# Patient Record
Sex: Male | Born: 1995 | Race: White | Hispanic: No | Marital: Single | Smoking: Never smoker
Health system: Southern US, Community
[De-identification: ages and names within clinical notes are randomized; demographics above are authoritative.]

## PROBLEM LIST (undated history)

## (undated) DIAGNOSIS — U071 COVID-19: Secondary | ICD-10-CM

## (undated) DIAGNOSIS — F909 Attention-deficit hyperactivity disorder, unspecified type: Secondary | ICD-10-CM

## (undated) HISTORY — PX: TONSILLECTOMY: SUR1361

## (undated) HISTORY — PX: WISDOM TOOTH EXTRACTION: SHX21

---
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
## 2015-06-13 ENCOUNTER — Ambulatory Visit
Admission: EM | Admit: 2015-06-13 | Discharge: 2015-06-13 | Disposition: A | Discharge status: Home / Self Care | Payer: Federal, State, Local not specified - PPO | Attending: Family Medicine | Admitting: Family Medicine

## 2015-06-13 DIAGNOSIS — A084 Viral intestinal infection, unspecified: Secondary | ICD-10-CM

## 2015-06-13 DIAGNOSIS — L739 Follicular disorder, unspecified: Secondary | ICD-10-CM

## 2015-06-13 DIAGNOSIS — IMO0001 Reserved for inherently not codable concepts without codable children: Secondary | ICD-10-CM

## 2015-06-13 DIAGNOSIS — L500 Allergic urticaria: Secondary | ICD-10-CM

## 2015-06-13 DIAGNOSIS — J302 Other seasonal allergic rhinitis: Secondary | ICD-10-CM | POA: Diagnosis not present

## 2015-06-13 DIAGNOSIS — H1013 Acute atopic conjunctivitis, bilateral: Secondary | ICD-10-CM

## 2015-06-13 DIAGNOSIS — H6593 Unspecified nonsuppurative otitis media, bilateral: Secondary | ICD-10-CM

## 2015-06-13 MED ORDER — SALINE SPRAY 0.65 % NA SOLN: 2.0000 | NASAL | Status: DC

## 2015-06-13 MED ORDER — RANITIDINE HCL 150 MG PO TABS: 150.0000 mg | ORAL_TABLET | Freq: Two times a day (BID) | ORAL | Status: DC

## 2015-06-13 MED ORDER — PREDNISONE 50 MG PO TABS: 50.0000 mg | ORAL_TABLET | Freq: Every day | ORAL | Status: AC

## 2015-06-13 MED ORDER — EPINEPHRINE 0.3 MG/0.3ML IJ SOAJ: 0.3000 mg | Freq: Once | INTRAMUSCULAR | Status: DC

## 2015-06-13 MED ORDER — CETIRIZINE HCL 10 MG PO TABS: 10.0000 mg | ORAL_TABLET | Freq: Two times a day (BID) | ORAL | Status: DC

## 2015-06-13 MED ORDER — HYDROCORTISONE 1 % EX CREA: TOPICAL_CREAM | CUTANEOUS | Status: DC

## 2015-06-13 NOTE — ED Provider Notes (Signed)
CSN: [id number]     Arrival date & time 06/13/15  1919 History   First MD Initiated Contact with Patient 06/13/15 1953     Chief Complaint  Patient presents with  . Breathing Problem    Pt reports he had an episode this afternoon after working with straw where he couldn't get his breath, nose was running, and he was coughing. Resolved with Benadryl. Also has has a "bump" near genitalia.    (Consider location/radiation/quality/duration/timing/severity/associated sxs/prior Treatment) HPI Comments: Single caucasian male works at [employer] known seasonal allergies and worsening symptoms when exposed to hay.  Also has had diarrhea x 3 days, eating usual diet denied bloody or black stools.  No known sick contacts.  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  Typically does not take any medications unless allergies really bad than benadryl 1-2 tabs po prn.  Really runny nose after hay exposure, itchy eyes, hives, coughing/hard to breath took benadryl and all symptoms except itchy eyes resolved.  Feeling good now except still hay dust on clothes and skin and itchy but no rash.  Has noticed pea sized lump left inguinal area would like checked also--been there for months (since last year) has tried squeezing it and did get pus out of it one time weeks ago.  Denied redness, rash.  Does trim genital hair.  Denied new sexual partners, fever, chills.  Denied asthma as child.  Never had allergy testing.  Allergic to peanuts.  Patient is a 20 y.o. male presenting with difficulty breathing. The history is provided by the patient.  Breathing Problem This is a new problem. The current episode started 3 to 5 hours ago. The problem occurs rarely. The problem has been resolved. Associated symptoms include shortness of breath. Pertinent negatives include no chest pain, no abdominal pain and no headaches. The symptoms are aggravated by exertion and coughing. The symptoms are relieved by medications.  Treatments tried: benadryl 25mg  po. The treatment provided significant relief.    History reviewed. No pertinent past medical history. Past Surgical History  Procedure Laterality Date  . Tonsillectomy     History reviewed. No pertinent family history. Social History  Substance Use Topics  . Smoking status: Never Smoker   . Smokeless tobacco: Never Used  . Alcohol Use: Yes     Comment: social    Review of Systems  Constitutional: Negative for fever, chills, diaphoresis, activity change, appetite change, fatigue and unexpected weight change.  HENT: Positive for congestion, postnasal drip, rhinorrhea and sneezing. Negative for dental problem, drooling, ear discharge, ear pain, facial swelling, hearing loss, mouth sores, nosebleeds, sinus pressure, sore throat, tinnitus, trouble swallowing and voice change.   Eyes: Negative for photophobia, pain, discharge, redness, itching and visual disturbance.  Respiratory: Positive for cough, shortness of breath and wheezing. Negative for choking, chest tightness and stridor.   Cardiovascular: Negative for chest pain, palpitations and leg swelling.  Gastrointestinal: Positive for diarrhea. Negative for nausea, vomiting, abdominal pain, constipation, blood in stool and abdominal distention.  Endocrine: Negative for cold intolerance and heat intolerance.  Genitourinary: Negative for dysuria.  Musculoskeletal: Negative for myalgias, back pain, joint swelling, arthralgias, gait problem, neck pain and neck stiffness.  Skin: Negative for color change, pallor, rash and wound.  Allergic/Immunologic: Positive for environmental allergies and food allergies. Negative for immunocompromised state.  Neurological: Negative for dizziness, tremors, seizures, syncope, facial asymmetry, speech difficulty, weakness, light-headedness, numbness and headaches.  Hematological: Negative for adenopathy. Does not bruise/bleed easily.  Psychiatric/Behavioral: Negative for  behavioral problems, confusion, sleep disturbance and agitation.    Allergies  Peanuts  Home Medications   Prior to Admission medications   Medication Sig Start Date End Date Taking? Authorizing Provider  cetirizine (ZYRTEC) 10 MG tablet Take 1 tablet (10 mg total) by mouth 2 (two) times daily. 06/13/15 07/14/15  Barbaraann Barthelina A Calypso Hagarty, NP  EPINEPHrine (EPIPEN 2-PAK) 0.3 mg/0.3 mL IJ SOAJ injection Inject 0.3 mLs (0.3 mg total) into the muscle once. 06/13/15   Barbaraann Barthelina A Tymira Horkey, NP  hydrocortisone cream 1 % Apply to affected area 2 times daily x 7 days 06/13/15   Barbaraann Barthelina A Jemel Ono, NP  predniSONE (DELTASONE) 50 MG tablet Take 1 tablet (50 mg total) by mouth daily with breakfast. 06/13/15 06/17/15  Barbaraann Barthelina A Harvin Konicek, NP  ranitidine (ZANTAC) 150 MG tablet Take 1 tablet (150 mg total) by mouth 2 (two) times daily. 06/13/15   Barbaraann Barthelina A Morrigan Wickens, NP  sodium chloride (OCEAN) 0.65 % SOLN nasal spray Place 2 sprays into both nostrils every 2 (two) hours while awake. 06/13/15 07/14/15  Barbaraann Barthelina A Marco Adelson, NP   Meds Ordered and Administered this Visit  Medications - No data to display  BP 104/56 mmHg  Pulse 90  Temp(Src) 98.4 F (36.9 C) (Oral)  Resp 18  Ht 5\' 5"  (1.651 m)  Wt 128 lb (58.06 kg)  BMI 21.30 kg/m2  SpO2 98% No data found.   Physical Exam  Constitutional: He is oriented to person, place, and time. Vital signs are normal. He appears well-developed and well-nourished. He is active and cooperative.  Non-toxic appearance. He does not have a sickly appearance. He does not appear ill. No distress.  HENT:  Head: Normocephalic and atraumatic.  Right Ear: Hearing, tympanic membrane, external ear and ear canal normal.  Left Ear: Hearing, tympanic membrane, external ear and ear canal normal.  Nose: Mucosal edema and rhinorrhea present. No nose lacerations, sinus tenderness, nasal deformity, septal deviation or nasal septal hematoma. No epistaxis.  No foreign bodies. Right sinus exhibits no maxillary sinus  tenderness and no frontal sinus tenderness. Left sinus exhibits no maxillary sinus tenderness and no frontal sinus tenderness.  Mouth/Throat: Uvula is midline and mucous membranes are normal. Mucous membranes are not pale, not dry and not cyanotic. He does not have dentures. No oral lesions. No trismus in the jaw. Normal dentition. No dental abscesses, uvula swelling, lacerations or dental caries. Posterior oropharyngeal edema and posterior oropharyngeal erythema present. No oropharyngeal exudate or tonsillar abscesses.  Cobblestoning posterior pharynx; bilateral TMs with air fluid level clear; nasal turbinates edema/erythema clear discharge  Eyes: EOM and lids are normal. Pupils are equal, round, and reactive to light. Right eye exhibits no chemosis, no discharge, no exudate and no hordeolum. No foreign body present in the right eye. Left eye exhibits no chemosis, no discharge, no exudate and no hordeolum. No foreign body present in the left eye. Right conjunctiva is injected. Right conjunctiva has no hemorrhage. Left conjunctiva is injected. Left conjunctiva has no hemorrhage. No scleral icterus. Right eye exhibits normal extraocular motion and no nystagmus. Left eye exhibits normal extraocular motion and no nystagmus. Right pupil is round and reactive. Left pupil is round and reactive. Pupils are equal.  3+ injection bilaterally eyelid and bulbar conjunctiva  Neck: Trachea normal and normal range of motion. Neck supple. No tracheal tenderness, no spinous process tenderness and no muscular tenderness present. No rigidity. No tracheal deviation, no edema, no erythema and normal range of motion present. No thyroid  mass and no thyromegaly present.  Cardiovascular: Normal rate, regular rhythm, S1 normal, S2 normal, normal heart sounds and intact distal pulses.  PMI is not displaced.  Exam reveals no gallop and no friction rub.   No murmur heard. Pulses:      Radial pulses are 2+ on the right side, and 2+ on  the left side.  Pulmonary/Chest: Effort normal and breath sounds normal. No stridor. No respiratory distress. He has no decreased breath sounds. He has no wheezes. He has no rhonchi. He has no rales.  Abdominal: Soft. Normal appearance and bowel sounds are normal. He exhibits no distension, no fluid wave and no ascites. There is no tenderness. There is no rigidity, no guarding and no CVA tenderness. Hernia confirmed negative in the ventral area.  Genitourinary:     Patient adjusted underwear to show affected area did not completely disrobe  Musculoskeletal: Normal range of motion. He exhibits no edema or tenderness.       Right shoulder: Normal.       Left shoulder: Normal.       Right elbow: Normal.      Left elbow: Normal.       Right hip: Normal.       Left hip: Normal.       Right knee: Normal.       Left knee: Normal.       Cervical back: Normal.       Thoracic back: Normal.       Right hand: Normal.       Left hand: Normal.  Lymphadenopathy:       Head (right side): No submental, no submandibular, no tonsillar, no preauricular, no posterior auricular and no occipital adenopathy present.       Head (left side): No submental, no submandibular, no tonsillar, no preauricular, no posterior auricular and no occipital adenopathy present.    He has no cervical adenopathy.       Right cervical: No superficial cervical, no deep cervical and no posterior cervical adenopathy present.      Left cervical: No superficial cervical, no deep cervical and no posterior cervical adenopathy present.       Left: No inguinal adenopathy present.  Neurological: He is alert and oriented to person, place, and time. He is not disoriented. He displays no atrophy and no tremor. No cranial nerve deficit or sensory deficit. He exhibits normal muscle tone. He displays no seizure activity. Coordination and gait normal. GCS eye subscore is 4. GCS verbal subscore is 5. GCS motor subscore is 6.  Strength 5/5  extremities equal bilaterally  Skin: Skin is warm, dry and intact. No abrasion, no bruising, no burn, no ecchymosis, no laceration, no lesion, no petechiae and no rash noted. He is not diaphoretic. No cyanosis or erythema. No pallor. Nails show no clubbing.  Skin covered in dirt/dust arms/face/neck  Psychiatric: He has a normal mood and affect. His speech is normal and behavior is normal. Judgment and thought content normal. Cognition and memory are normal.  Nursing note and vitals reviewed.   ED Course  Procedures (including critical care time)  Labs Review Labs Reviewed - No data to display  Imaging Review No results found.  2000 refused dexamethasone IM or po medication administration in clinic for allergies.  MDM   1. Allergic reaction, urticaria   2. Seasonal allergic rhinitis   3. Viral gastroenteritis   4. Otitis media with effusion, bilateral   5. Folliculitis    Benadryl causes  much sedation for patient discussed may use zyrtec 10mg  po BID or zyrtec 10mg  po qam and then benadryl 25-50mg  po pm/HS as directed.  Prednisone 50mg  po daily x 5 days breakfast.  Epipen prescribed for patient as history of urticaria with hay exposure prior to this episode of bronchospasm, diarrhea, rhinitis, itching.  Recommended patient follow up with allergist for testing.  Consider allergy induced asthma testing.  Patient has been trained by [employer] how to administer epipen in the past.  Discussed with patient recurrent exposure to known allergens can have worse reactions on subsequent exposures.  Recommended he not expose himself to known allergens this week at work. Patient reported he is currently having seasonal allergies typically doesn't take any benadryl daily only if very bad symptoms.  Discussed with patient viral illness, seasonal allergies may have caused ideopathic reaction or enhanced reaction when exposed to known trigger-hay today.  Consider singulair 10mg  po daily if zyrtec 10mg  po  daily not controlling symptoms patient refused at this time.  Zantac 150mg  po BID if urticaria reoccurs.  Symptomatic therapy suggested.  Warm to cool water soaks or tepid showers after exposure to possible trigger--cat and grass.  Avoid possible triggers until further evaluation from allergist.  Call or return to clinic as needed if these symptoms worsen or fail to improve as anticipated.  Go to ER if difficulty breathing, swallowing, dizziness or chest pain for immediate evaluation and treatment follow up with [facility] if ER visit required.   Exitcare handout on allergic uticaria given to patient.  Directed to follow up with [facility] for allergist/testing referral.  Patient/mother verbalized agreement and understanding of treatment plan and had no further questions at this time.   P2:  Avoidance and hand washing.  Patient may use normal saline nasal spray as needed.  Consider antihistamine or nasal steroid use.  Avoid triggers if possible.  Shower prior to bedtime if exposed to triggers.  If allergic dust/dust mites recommend mattress/pillow covers/encasements; washing linens, vacuuming, sweeping, dusting weekly.  Call or return to clinic as needed if these symptoms worsen or fail to improve as anticipated.   Exitcare handout on allergic rhinitis given to patient.  Patient verbalized understanding of instructions, agreed with plan of care and had no further questions at this time.  P2:  Avoidance and hand washing.   Start antihistamine medication.  Shower after allergen exposure prior to bed.  Hygiene discussed.  Patient to apply warm packs prn as directed.  Instructed patient to not rub eyes.  May use over the counter eye drops/tears for pain/symptom relief.  Return to clinic if headache, fever greater than 100.62F, nausea/vomiting, purulent discharge/matting unable to open eye without using fingers, foreign body sensation, ciliary flush, worsening photophobia or vision.  Call or return to clinic as needed if these  symptoms worsen or fail to improve as anticipated.  Patient given Exitcare handout on allergic conjunctivitis.  Patient verbalized agreement and understanding of treatment plan.   P2:  Hand washing   I have recommended clear fluids and bland diet.  Avoid dairy/spicy, fried and large portions of meat while having nausea.  If vomiting hold po intake x 1 hour.  Then sips clear fluids like broths, ginger ale, power ade, gatorade, pedialyte may advance to soft/bland if no vomiting x 24 hours and appetite returned otherwise hydration main focus.     Return to the clinic if symptoms persist or worsen; I have alerted the patient to call if high fever, dehydration, marked weakness, fainting,  increased abdominal pain, blood in stool or vomit (red or black).  Discussed with patient allergic reactions could also cause stomach upset/diarrhea.   Exitcare handout on gastroenteritis given to patient. Spouse and Patient verbalized agreement and understanding of treatment plan and had no further questions at this time.  Supportive treatment.   No evidence of invasive bacterial infection, non toxic and well hydrated.  This is most likely self limiting viral infection.  I do not see where any further testing or imaging is necessary at this time.   I will suggest supportive care, rest, good hygiene and encourage the patient to take adequate fluids.  The patient is to return to clinic or EMERGENCY ROOM if symptoms worsen or change significantly e.g. ear pain, fever, purulent discharge from ears or bleeding.  Exitcare handout on otitis media with effusion given to patient.  Patient verbalized agreement and understanding of treatment plan.    Discussed folliculitis, pseudofolliculitis barbae with patient e.g. Use shaving cream, shave with direction of hair growth not against, lather up well and shower/bathe first to allow hair to soften.  Refrain from shaving at this time until current inflammation resolved.  May apply OTC  hydrocortisone 1% cream sparingly to affected hair follicles if irritation after shaving. Wash hands thoroughly after application.   Discussed the difference between razor burn, cellulitis and folliculitis with patient.  Exitcare handout on folliculitis given to patient.  Patient verbalized understanding of information, instructions, agreed with plan of care and had no further questions at this time.     Barbaraann Barthel, NP 06/13/15 2103  Barbaraann Barthel, NP 06/13/15 2105

## 2015-06-13 NOTE — Discharge Instructions (Signed)
Allergic Rhinitis Allergic rhinitis is when the mucous membranes in the nose respond to allergens. Allergens are particles in the air that cause your body to have an allergic reaction. This causes you to release allergic antibodies. Through a chain of events, these eventually cause you to release histamine into the blood stream. Although meant to protect the body, it is this release of histamine that causes your discomfort, such as frequent sneezing, congestion, and an itchy, runny nose.  CAUSES Seasonal allergic rhinitis (hay fever) is caused by pollen allergens that may come from grasses, trees, and weeds. Year-round allergic rhinitis (perennial allergic rhinitis) is caused by allergens such as house dust mites, pet dander, and mold spores. SYMPTOMS  Nasal stuffiness (congestion).  Itchy, runny nose with sneezing and tearing of the eyes. DIAGNOSIS Your health care provider can help you determine the allergen or allergens that trigger your symptoms. If you and your health care provider are unable to determine the allergen, skin or blood testing may be used. Your health care provider will diagnose your condition after taking your health history and performing a physical exam. Your health care provider may assess you for other related conditions, such as asthma, pink eye, or an ear infection. TREATMENT Allergic rhinitis does not have a cure, but it can be controlled by:  Medicines that block allergy symptoms. These may include allergy shots, nasal sprays, and oral antihistamines.  Avoiding the allergen. Hay fever may often be treated with antihistamines in pill or nasal spray forms. Antihistamines block the effects of histamine. There are over-the-counter medicines that may help with nasal congestion and swelling around the eyes. Check with your health care provider before taking or giving this medicine. If avoiding the allergen or the medicine prescribed do not work, there are many new medicines  your health care provider can prescribe. Stronger medicine may be used if initial measures are ineffective. Desensitizing injections can be used if medicine and avoidance does not work. Desensitization is when a patient is given ongoing shots until the body becomes less sensitive to the allergen. Make sure you follow up with your health care provider if problems continue. HOME CARE INSTRUCTIONS It is not possible to completely avoid allergens, but you can reduce your symptoms by taking steps to limit your exposure to them. It helps to know exactly what you are allergic to so that you can avoid your specific triggers. SEEK MEDICAL CARE IF:  You have a fever.  You develop a cough that does not stop easily (persistent).  You have shortness of breath.  You start wheezing.  Symptoms interfere with normal daily activities.   This information is not intended to replace advice given to you by your health care provider. Make sure you discuss any questions you have with your health care provider.   Document Released: 10/22/2000 Document Revised: 02/17/2014 Document Reviewed: 10/04/2012 Elsevier Interactive Patient Education 2016 [employer] Inc[employer]  Allergy Skin Testing WHY AM I HAVING THIS TEST? Allergy skin testing is done to check whether you have an allergy to something. Testing may be done in one of two ways:  Injecting a small amount of the substance you may be allergic to (allergen).  Applying patches to your skin. Your health care provider will determine the results of your test by checking for an allergic reaction on the skin where the allergen was injected or where the patches were applied.  HOW DO I PREPARE FOR THE TEST?  Let your health care provider know about all medicines you  are taking, including vitamins, herbs, eye drops, creams, and over-the-counter medicines. Some medicines can affect test results. Your health care provider will let you know when to stop taking those medicines and  when you can begin taking them again.  If you are having a patch test:  Do not apply ointments, creams, or lotion to the skin where the patch will be placed. Usually, the patches are placed on your forearm or on your back.  Bring any items that you think you are allergic to, such as cosmetics, soaps, and perfume. WILL I NEED TO DO ANYTHING AT HOME? If you will receive an injection, you will not need to do anything at home. If patches will be applied to your skin, you will need to:  Wear them for 48 hours.  Return to your health care provider's office to have them removed. Do not remove them yourself.  Avoid bathing and activities that cause heavy sweating until after the patches are removed. WHAT ARE THE REFERENCE RANGES? Reference ranges are considered healthy ranges established after testing a large group of healthy people. Reference ranges may vary among different people, labs, and hospitals.  The reference range for allergy skin testing is a swollen area of skin (wheal) less than 3mm in diameter, with surrounding redness and swelling (flare) less than 10mm in diameter.  WHAT DO THE RESULTS MEAN?  A result within the reference range means you are probably not allergic to the allergen.  A result in which the wheal is 3mm or more and the flare is 10mm or more means you are likely allergic to the allergen. Your health care provider will consider the results of your test in addition to your symptoms before diagnosing you with an allergy. Talk with your health care provider to discuss your results, treatment options, and if necessary, the need for more tests. Talk with your health care provider if you have any questions about your results.   This information is not intended to replace advice given to you by your health care provider. Make sure you discuss any questions you have with your health care provider.   Document Released: 02/20/2004 Document Revised: 02/17/2014 Document Reviewed:  11/08/2013 Elsevier Interactive Patient Education 2016 [employer] Inc[employer]  Angioedema Angioedema is a sudden swelling of tissues, often of the skin. It can occur on the face or genitals or in the abdomen or other body parts. The swelling usually develops over a short period and gets better in 24 to 48 hours. It often begins during the night and is found when the person wakes up. The person may also get red, itchy patches of skin (hives). Angioedema can be dangerous if it involves swelling of the air passages.  Depending on the cause, episodes of angioedema may only happen once, come back in unpredictable patterns, or repeat for several years and then gradually fade away.  CAUSES  Angioedema can be caused by an allergic reaction to various triggers. It can also result from nonallergic causes, including reactions to drugs, immune system disorders, viral infections, or an abnormal gene that is passed to you from your parents (hereditary). For some people with angioedema, the cause is unknown.  Some things that can trigger angioedema include:   Foods.   Medicines, such as ACE inhibitors, ARBs, nonsteroidal anti-inflammatory agents, or estrogen.   Latex.   Animal saliva.   Insect stings.   Dyes used in X-rays.   Mild injury.   Dental work.  Surgery.  Stress.   Sudden changes  in temperature.   Exercise. SIGNS AND SYMPTOMS   Swelling of the skin.  Hives. If these are present, there is also intense itching.  Redness in the affected area.   Pain in the affected area.  Swollen lips or tongue.  Breathing problems. This may happen if the air passages swell.  Wheezing. If internal organs are involved, there may be:   Nausea.   Abdominal pain.   Vomiting.   Difficulty swallowing.   Difficulty passing urine. DIAGNOSIS   Your health care provider will examine the affected area and take a medical and family history.  Various tests may be done to help determine  the cause. Tests may include:  Allergy skin tests to see if the problem is an allergic reaction.   Blood tests to check for hereditary angioedema.   Tests to check for underlying diseases that could cause the condition.   A review of your medicines, including over-the-counter medicines, may be done. TREATMENT  Treatment will depend on the cause of the angioedema. Possible treatments include:   Removal of anything that triggered the condition (such as stopping certain medicines).   Medicines to treat symptoms or prevent attacks. Medicines given may include:   Antihistamines.   Epinephrine injection.   Steroids.   Hospitalization may be required for severe attacks. If the air passages are affected, it can be an emergency. Tubes may need to be placed to keep the airway open. HOME CARE INSTRUCTIONS   Take all medicines as directed by your health care provider.  If you were given medicines for emergency allergy treatment, always carry them with you.  Wear a medical bracelet as directed by your health care provider.   Avoid known triggers. SEEK MEDICAL CARE IF:   You have repeat attacks of angioedema.   Your attacks are more frequent or more severe despite preventive measures.   You have hereditary angioedema and are considering having children. It is important to discuss with your health care provider the risks of passing the condition on to your children. SEEK IMMEDIATE MEDICAL CARE IF:   You have severe swelling of the mouth, tongue, or lips.  You have difficulty breathing.   You have difficulty swallowing.   You faint. MAKE SURE YOU:  Understand these instructions.  Will watch your condition.  Will get help right away if you are not doing well or get worse.   This information is not intended to replace advice given to you by your health care provider. Make sure you discuss any questions you have with your health care provider.   Document Released:  04/07/2001 Document Revised: 02/17/2014 Document Reviewed: 09/20/2012 Elsevier Interactive Patient Education 2016 [employer] Inc[employer] Folliculitis Folliculitis is redness, soreness, and swelling (inflammation) of the hair follicles. This condition can occur anywhere on the body. People with weakened immune systems, diabetes, or obesity have a greater risk of getting folliculitis. CAUSES  Bacterial infection. This is the most common cause.  Fungal infection.  Viral infection.  Contact with certain chemicals, especially oils and tars. Long-term folliculitis can result from bacteria that live in the nostrils. The bacteria may trigger multiple outbreaks of folliculitis over time. SYMPTOMS Folliculitis most commonly occurs on the scalp, thighs, legs, back, buttocks, and areas where hair is shaved frequently. An early sign of folliculitis is a small, white or yellow, pus-filled, itchy lesion (pustule). These lesions appear on a red, inflamed follicle. They are usually less than 0.2 inches (5 mm) wide. When there is an infection of the follicle  that goes deeper, it becomes a boil or furuncle. A group of closely packed boils creates a larger lesion (carbuncle). Carbuncles tend to occur in hairy, sweaty areas of the body. DIAGNOSIS  Your caregiver can usually tell what is wrong by doing a physical exam. A sample may be taken from one of the lesions and tested in a lab. This can help determine what is causing your folliculitis. TREATMENT  Treatment may include:  Applying warm compresses to the affected areas.  Taking antibiotic medicines orally or applying them to the skin.  Draining the lesions if they contain a large amount of pus or fluid.  Laser hair removal for cases of long-lasting folliculitis. This helps to prevent regrowth of the hair. HOME CARE INSTRUCTIONS  Apply warm compresses to the affected areas as directed by your caregiver.  If antibiotics are prescribed, take them as directed.  Finish them even if you start to feel better.  You may take over-the-counter medicines to relieve itching.  Do not shave irritated skin.  Follow up with your caregiver as directed. SEEK IMMEDIATE MEDICAL CARE IF:   You have increasing redness, swelling, or pain in the affected area.  You have a fever. MAKE SURE YOU:  Understand these instructions.  Will watch your condition.  Will get help right away if you are not doing well or get worse.   This information is not intended to replace advice given to you by your health care provider. Make sure you discuss any questions you have with your health care provider.   Document Released: 04/07/2001 Document Revised: 02/17/2014 Document Reviewed: 04/29/2011 Elsevier Interactive Patient Education 2016 [employer] Viral Gastroenteritis Viral gastroenteritis is also known as stomach flu. This condition affects the stomach and intestinal tract. It can cause sudden diarrhea and vomiting. The illness typically lasts 3 to 8 days. Most people develop an immune response that eventually gets rid of the virus. While this natural response develops, the virus can make you quite ill. CAUSES  Many different viruses can cause gastroenteritis, such as rotavirus or noroviruses. You can catch one of these viruses by consuming contaminated food or water. You may also catch a virus by sharing utensils or other personal items with an infected person or by touching a contaminated surface. SYMPTOMS  The most common symptoms are diarrhea and vomiting. These problems can cause a severe loss of body fluids (dehydration) and a body salt (electrolyte) imbalance. Other symptoms may include:  Fever.  Headache.  Fatigue.  Abdominal pain. DIAGNOSIS  Your caregiver can usually diagnose viral gastroenteritis based on your symptoms and a physical exam. A stool sample may also be taken to test for the presence of viruses or other infections. TREATMENT  This illness  typically goes away on its own. Treatments are aimed at rehydration. The most serious cases of viral gastroenteritis involve vomiting so severely that you are not able to keep fluids down. In these cases, fluids must be given through an intravenous line (IV). HOME CARE INSTRUCTIONS   Drink enough fluids to keep your urine clear or pale yellow. Drink small amounts of fluids frequently and increase the amounts as tolerated.  Ask your caregiver for specific rehydration instructions.  Avoid:  Foods high in sugar.  Alcohol.  Carbonated drinks.  Tobacco.  Juice.  Caffeine drinks.  Extremely hot or cold fluids.  Fatty, greasy foods.  Too much intake of anything at one time.  Dairy products until 24 to 48 hours after diarrhea stops.  You may consume  probiotics. Probiotics are active cultures of beneficial bacteria. They may lessen the amount and number of diarrheal stools in adults. Probiotics can be found in yogurt with active cultures and in supplements.  Wash your hands well to avoid spreading the virus.  Only take over-the-counter or prescription medicines for pain, discomfort, or fever as directed by your caregiver. Do not give aspirin to children. Antidiarrheal medicines are not recommended.  Ask your caregiver if you should continue to take your regular prescribed and over-the-counter medicines.  Keep all follow-up appointments as directed by your caregiver. SEEK IMMEDIATE MEDICAL CARE IF:   You are unable to keep fluids down.  You do not urinate at least once every 6 to 8 hours.  You develop shortness of breath.  You notice blood in your stool or vomit. This may look like coffee grounds.  You have abdominal pain that increases or is concentrated in one small area (localized).  You have persistent vomiting or diarrhea.  You have a fever.  The patient is a child younger than 3 months, and he or she has a fever.  The patient is a child older than 3 months, and he  or she has a fever and persistent symptoms.  The patient is a child older than 3 months, and he or she has a fever and symptoms suddenly get worse.  The patient is a baby, and he or she has no tears when crying. MAKE SURE YOU:   Understand these instructions.  Will watch your condition.  Will get help right away if you are not doing well or get worse.   This information is not intended to replace advice given to you by your health care provider. Make sure you discuss any questions you have with your health care provider.   Document Released: 01/27/2005 Document Revised: 04/21/2011 Document Reviewed: 11/13/2010 Elsevier Interactive Patient Education 2016 [employer] Bronchospasm, Adult A bronchospasm is when the tubes that carry air in and out of your lungs (airways) spasm or tighten. During a bronchospasm it is hard to breathe. This is because the airways get smaller. A bronchospasm can be triggered by:  Allergies. These may be to animals, pollen, food, or mold.  Infection. This is a common cause of bronchospasm.  Exercise.  Irritants. These include pollution, cigarette smoke, strong odors, aerosol sprays, and paint fumes.  Weather changes.  Stress.  Being emotional. HOME CARE   Always have a plan for getting help. Know when to call your doctor and local emergency services (911 in the [place] Know where you can get emergency care.  Only take medicines as told by your doctor.  If you were prescribed an inhaler or nebulizer machine, ask your doctor how to use it correctly. Always use a spacer with your inhaler if you were given one.  Stay calm during an attack. Try to relax and breathe more slowly.  Control your home environment:  Change your heating and air conditioning filter at least once a month.  Limit your use of fireplaces and wood stoves.  Do not  smoke. Do not  allow smoking in your home.  Avoid perfumes and fragrances.  Get rid of pests (such as roaches  and mice) and their droppings.  Throw away plants if you see mold on them.  Keep your house clean and dust free.  Replace carpet with wood, tile, or vinyl flooring. Carpet can trap dander and dust.  Use allergy-proof pillows, mattress covers, and box spring covers.  Wash bed sheets and  blankets every week in hot water. Dry them in a dryer.  Use blankets that are made of polyester or cotton.  Wash hands frequently. GET HELP IF:  You have muscle aches.  You have chest pain.  The thick spit you spit or cough up (sputum) changes from clear or white to yellow, green, gray, or bloody.  The thick spit you spit or cough up gets thicker.  There are problems that may be related to the medicine you are given such as:  A rash.  Itching.  Swelling.  Trouble breathing. GET HELP RIGHT AWAY IF:  You feel you cannot breathe or catch your breath.  You cannot stop coughing.  Your treatment is not helping you breathe better.  You have very bad chest pain. MAKE SURE YOU:   Understand these instructions.  Will watch your condition.  Will get help right away if you are not doing well or get worse.   This information is not intended to replace advice given to you by your health care provider. Make sure you discuss any questions you have with your health care provider.   Document Released: 11/24/2008 Document Revised: 02/17/2014 Document Reviewed: 07/20/2012 Elsevier Interactive Patient Education 2016 [employer] Inc[employer] Hives Hives are itchy, red, swollen areas of the skin. They can vary in size and location on your body. Hives can come and go for hours or several days (acute hives) or for several weeks (chronic hives). Hives do not spread from person to person (noncontagious). They may get worse with scratching, exercise, and emotional stress. CAUSES   Allergic reaction to food, additives, or drugs.  Infections, including the common cold.  Illness, such as vasculitis, lupus, or  thyroid disease.  Exposure to sunlight, heat, or cold.  Exercise.  Stress.  Contact with chemicals. SYMPTOMS   Red or white swollen patches on the skin. The patches may change size, shape, and location quickly and repeatedly.  Itching.  Swelling of the hands, feet, and face. This may occur if hives develop deeper in the skin. DIAGNOSIS  Your caregiver can usually tell what is wrong by performing a physical exam. Skin or blood tests may also be done to determine the cause of your hives. In some cases, the cause cannot be determined. TREATMENT  Mild cases usually get better with medicines such as antihistamines. Severe cases may require an emergency epinephrine injection. If the cause of your hives is known, treatment includes avoiding that trigger.  HOME CARE INSTRUCTIONS   Avoid causes that trigger your hives.  Take antihistamines as directed by your caregiver to reduce the severity of your hives. Non-sedating or low-sedating antihistamines are usually recommended. Do not drive while taking an antihistamine.  Take any other medicines prescribed for itching as directed by your caregiver.  Wear loose-fitting clothing.  Keep all follow-up appointments as directed by your caregiver. SEEK MEDICAL CARE IF:   You have persistent or severe itching that is not relieved with medicine.  You have painful or swollen joints. SEEK IMMEDIATE MEDICAL CARE IF:   You have a fever.  Your tongue or lips are swollen.  You have trouble breathing or swallowing.  You feel tightness in the throat or chest.  You have abdominal pain. These problems may be the first sign of a life-threatening allergic reaction. Call your local emergency services (911 in [place] MAKE SURE YOU:   Understand these instructions.  Will watch your condition.  Will get help right away if you are not doing well or get worse.  This information is not intended to replace advice given to you by your health care  provider. Make sure you discuss any questions you have with your health care provider.   Document Released: 01/27/2005 Document Revised: 02/01/2013 Document Reviewed: 04/22/2011 Elsevier Interactive Patient Education [employer]

## 2016-08-01 ENCOUNTER — Other Ambulatory Visit: Payer: Federal, State, Local not specified - PPO

## 2016-08-08 ENCOUNTER — Ambulatory Visit: Admit: 2016-08-08 | Payer: Federal, State, Local not specified - PPO | Admitting: Surgery

## 2016-08-08 SURGERY — EXCISION, PILONIDAL CYST, EXTENSIVE
Anesthesia: Choice

## 2016-08-22 DIAGNOSIS — F419 Anxiety disorder, unspecified: Secondary | ICD-10-CM | POA: Insufficient documentation

## 2016-12-15 ENCOUNTER — Encounter: Payer: Self-pay | Admitting: *Deleted

## 2016-12-15 ENCOUNTER — Ambulatory Visit
Admission: EM | Admit: 2016-12-15 | Discharge: 2016-12-15 | Disposition: A | Discharge status: Home / Self Care | Payer: Federal, State, Local not specified - PPO | Attending: Family Medicine | Admitting: Family Medicine

## 2016-12-15 DIAGNOSIS — R21 Rash and other nonspecific skin eruption: Secondary | ICD-10-CM | POA: Diagnosis not present

## 2016-12-15 DIAGNOSIS — L739 Follicular disorder, unspecified: Secondary | ICD-10-CM | POA: Diagnosis not present

## 2016-12-15 MED ORDER — AMOXICILLIN 875 MG PO TABS: 875.0000 mg | ORAL_TABLET | Freq: Two times a day (BID) | ORAL | 0 refills | Status: DC

## 2016-12-15 NOTE — ED Triage Notes (Signed)
Generalized rash x2 weeks with itching.

## 2016-12-15 NOTE — ED Provider Notes (Signed)
[facility]    CSN: [id number] Arrival date & time: 12/15/16  1220     History   Chief Complaint Chief Complaint  Patient presents with  . Rash    HPI Garrett Hamilton is a 21 y.o. male.   C/o small, red, itchy, bumps rash on body, mainly upper arms and some on abdomen.    The history is provided by the patient.    History reviewed. No pertinent past medical history.  There are no active problems to display for this patient.   Past Surgical History:  Procedure Laterality Date  . TONSILLECTOMY         Home Medications    Prior to Admission medications   Medication Sig Start Date End Date Taking? Authorizing Provider  amoxicillin (AMOXIL) 875 MG tablet Take 1 tablet (875 mg total) 2 (two) times daily by mouth. 12/15/16   Payton Mccallumonty, Tom Ragsdale, MD  cetirizine (ZYRTEC) 10 MG tablet Take 1 tablet (10 mg total) by mouth 2 (two) times daily. 06/13/15 07/14/15  Betancourt, Jarold Songina A, NP  EPINEPHrine (EPIPEN 2-PAK) 0.3 mg/0.3 mL IJ SOAJ injection Inject 0.3 mLs (0.3 mg total) into the muscle once. 06/13/15   Betancourt, Jarold Songina A, NP  hydrocortisone cream 1 % Apply to affected area 2 times daily x 7 days 06/13/15   Betancourt, Jarold Songina A, NP  ranitidine (ZANTAC) 150 MG tablet Take 1 tablet (150 mg total) by mouth 2 (two) times daily. 06/13/15   Betancourt, Jarold Songina A, NP  sodium chloride (OCEAN) 0.65 % SOLN nasal spray Place 2 sprays into both nostrils every 2 (two) hours while awake. 06/13/15 07/14/15  BetancourtJarold Song, Tina A, NP    Family History History reviewed. No pertinent family history.  Social History Social History   Tobacco Use  . Smoking status: Never Smoker  . Smokeless tobacco: Current User  Substance Use Topics  . Alcohol use: Yes    Comment: social  . Drug use: No     Allergies   Peanuts [peanut oil]   Review of Systems Review of Systems   Physical Exam Triage Vital Signs ED Triage Vitals [12/15/16 1300]  Enc Vitals Group     BP 126/75     Pulse Rate 72   Resp 16     Temp 98.3 F (36.8 C)     Temp Source Oral     SpO2 100 %     Weight      Height      Head Circumference      Peak Flow      Pain Score      Pain Loc      Pain Edu?      Excl. in GC?    No data found.  Updated Vital Signs BP 126/75 (BP Location: Left Arm)   Pulse 72   Temp 98.3 F (36.8 C) (Oral)   Resp 16   SpO2 100%   Visual Acuity Right Eye Distance:   Left Eye Distance:   Bilateral Distance:    Right Eye Near:   Left Eye Near:    Bilateral Near:     Physical Exam  Constitutional: He appears well-developed and well-nourished. No distress.  Skin: Rash noted. Rash is papular and pustular. He is not diaphoretic.  Nursing note and vitals reviewed.    UC Treatments / Results  Labs (all labs ordered are listed, but only abnormal results are displayed) Labs Reviewed - No data to display  EKG  EKG Interpretation None  Radiology No results found.  Procedures Procedures (including critical care time)  Medications Ordered in UC Medications - No data to display   Initial Impression / Assessment and Plan / UC Course  I have reviewed the triage vital signs and the nursing notes.  Pertinent labs & imaging results that were available during my care of the patient were reviewed by me and considered in my medical decision making (see chart for details).       Final Clinical Impressions(s) / UC Diagnoses   Final diagnoses:  Folliculitis    New Prescriptions This SmartLink is deprecated. Use AVSMEDLIST instead to display the medication list for a patient.  1.  diagnosis reviewed with patient 2. rx as per orders above; reviewed possible side effects, interactions, risks and benefits  3. Follow-up prn if symptoms worsen or don't improve  Controlled Substance Prescriptions [place] Controlled Substance Registry consulted? Not Applicable   Payton Mccallum, MD 12/15/16 1435

## 2017-02-06 ENCOUNTER — Encounter: Payer: Self-pay | Admitting: *Deleted

## 2017-02-06 ENCOUNTER — Ambulatory Visit
Admission: EM | Admit: 2017-02-06 | Discharge: 2017-02-06 | Disposition: A | Discharge status: Home / Self Care | Payer: Federal, State, Local not specified - PPO | Attending: Family Medicine | Admitting: Family Medicine

## 2017-02-06 DIAGNOSIS — Z202 Contact with and (suspected) exposure to infections with a predominantly sexual mode of transmission: Secondary | ICD-10-CM

## 2017-02-06 DIAGNOSIS — Z113 Encounter for screening for infections with a predominantly sexual mode of transmission: Secondary | ICD-10-CM | POA: Diagnosis not present

## 2017-02-06 HISTORY — DX: Attention-deficit hyperactivity disorder, unspecified type: F90.9

## 2017-02-06 LAB — CHLAMYDIA/NGC RT PCR (ARMC ONLY)
CHLAMYDIA TR: NOT DETECTED
N gonorrhoeae: NOT DETECTED

## 2017-02-06 NOTE — ED Triage Notes (Addendum)
Pt here for STD testing. Recent sex with woman dx with gonorrhea. Asymptomatic at present.

## 2017-02-06 NOTE — ED Provider Notes (Signed)
[facility]    CSN: [id number] Arrival date & time: 02/06/17  1720     History   Chief Complaint Chief Complaint  Patient presents with  . Exposure to STD    HPI Garrett Hamilton is a 21 y.o. male.   21 yo male with a c/o here for STD testing after possible exposure to STD. States his recent girlfriend, with whom he had recently had unprotected sex, was recently diagnosed with gonorrhea and informed the patient yesterday. Patient denies any symptoms. Denies penile discharge, dysuria, hematuria, rash, fevers, chills.    The history is provided by the patient.    Past Medical History:  Diagnosis Date  . ADHD     There are no active problems to display for this patient.   Past Surgical History:  Procedure Laterality Date  . TONSILLECTOMY         Home Medications    Prior to Admission medications   Medication Sig Start Date End Date Taking? Authorizing Provider  FLUoxetine (PROZAC) 40 MG capsule Take 40 mg by mouth daily.   Yes [provider]  methylphenidate 36 MG PO CR tablet Take 36 mg by mouth daily.   Yes [provider]  amoxicillin (AMOXIL) 875 MG tablet Take 1 tablet (875 mg total) 2 (two) times daily by mouth. 12/15/16   Payton Mccallumonty, Magenta Schmiesing, MD  cetirizine (ZYRTEC) 10 MG tablet Take 1 tablet (10 mg total) by mouth 2 (two) times daily. 06/13/15 07/14/15  Betancourt, Jarold Songina A, NP  EPINEPHrine (EPIPEN 2-PAK) 0.3 mg/0.3 mL IJ SOAJ injection Inject 0.3 mLs (0.3 mg total) into the muscle once. 06/13/15   Betancourt, Jarold Songina A, NP  hydrocortisone cream 1 % Apply to affected area 2 times daily x 7 days 06/13/15   Betancourt, Jarold Songina A, NP  ranitidine (ZANTAC) 150 MG tablet Take 1 tablet (150 mg total) by mouth 2 (two) times daily. 06/13/15   Betancourt, Jarold Songina A, NP  sodium chloride (OCEAN) 0.65 % SOLN nasal spray Place 2 sprays into both nostrils every 2 (two) hours while awake. 06/13/15 07/14/15  Betancourt, Jarold Songina A, NP    Family History Family History    Problem Relation Age of Onset  . Healthy Mother   . Healthy Father     Social History Social History   Tobacco Use  . Smoking status: Never Smoker  . Smokeless tobacco: Current User  Substance Use Topics  . Alcohol use: Yes    Comment: social  . Drug use: No     Allergies   Peanuts [peanut oil]   Review of Systems Review of Systems   Physical Exam Triage Vital Signs ED Triage Vitals  Enc Vitals Group     BP 02/06/17 1755 124/76     Pulse Rate 02/06/17 1755 83     Resp 02/06/17 1755 16     Temp 02/06/17 1755 98.6 F (37 C)     Temp Source 02/06/17 1755 Oral     SpO2 02/06/17 1755 100 %     Weight 02/06/17 1756 138 lb (62.6 kg)     Height 02/06/17 1756 5\' 6"  (1.676 m)     Head Circumference --      Peak Flow --      Pain Score --      Pain Loc --      Pain Edu? --      Excl. in GC? --    No data found.  Updated Vital Signs BP 124/76 (BP Location: Left  Arm)   Pulse 83   Temp 98.6 F (37 C) (Oral)   Resp 16   Ht 5\' 6"  (1.676 m)   Wt 138 lb (62.6 kg)   SpO2 100%   BMI 22.27 kg/m   Visual Acuity Right Eye Distance:   Left Eye Distance:   Bilateral Distance:    Right Eye Near:   Left Eye Near:    Bilateral Near:     Physical Exam  Constitutional: He appears well-developed and well-nourished. No distress.  Genitourinary: Testes normal and penis normal. No penile erythema or penile tenderness. No discharge found.  Skin: He is not diaphoretic.  Nursing note and vitals reviewed.    UC Treatments / Results  Labs (all labs ordered are listed, but only abnormal results are displayed) Labs Reviewed  CHLAMYDIA/NGC RT PCR ([facility] ONLY)  HIV ANTIBODY (ROUTINE TESTING)  RPR    EKG  EKG Interpretation None       Radiology No results found.  Procedures Procedures (including critical care time)  Medications Ordered in UC Medications - No data to display   Initial Impression / Assessment and Plan / UC Course  I have reviewed  the triage vital signs and the nursing notes.  Pertinent labs & imaging results that were available during my care of the patient were reviewed by me and considered in my medical decision making (see chart for details).       Final Clinical Impressions(s) / UC Diagnoses   Final diagnoses:  Screen for STD (sexually transmitted disease)  STD exposure    ED Discharge Orders    None     1. diagnosis reviewed with patient 2. Check std tests as per orders above 3. Further management pending lab results 4. Follow-up prn  Controlled Substance Prescriptions [place] Controlled Substance Registry consulted? Not Applicable   Payton Mccallumonty, Kweku Stankey, MD 02/06/17 [phone]

## 2017-02-10 LAB — HIV ANTIBODY (ROUTINE TESTING W REFLEX): HIV Screen 4th Generation wRfx: NONREACTIVE

## 2017-02-10 LAB — RPR: RPR Ser Ql: NONREACTIVE

## 2017-03-09 ENCOUNTER — Ambulatory Visit
Admission: EM | Admit: 2017-03-09 | Discharge: 2017-03-09 | Disposition: A | Discharge status: Home / Self Care | Payer: Federal, State, Local not specified - PPO | Attending: Family Medicine | Admitting: Family Medicine

## 2017-03-09 ENCOUNTER — Other Ambulatory Visit: Payer: Self-pay

## 2017-03-09 DIAGNOSIS — J029 Acute pharyngitis, unspecified: Secondary | ICD-10-CM | POA: Diagnosis not present

## 2017-03-09 DIAGNOSIS — Z202 Contact with and (suspected) exposure to infections with a predominantly sexual mode of transmission: Secondary | ICD-10-CM

## 2017-03-09 DIAGNOSIS — Z113 Encounter for screening for infections with a predominantly sexual mode of transmission: Secondary | ICD-10-CM | POA: Diagnosis not present

## 2017-03-09 LAB — RAPID STREP SCREEN (MED CTR MEBANE ONLY): Streptococcus, Group A Screen (Direct): NEGATIVE

## 2017-03-09 NOTE — Discharge Instructions (Signed)
Take medication as discussed over the counter. Rest. Drink plenty of fluids.   Follow up with your primary care physician this week as needed. Return to Urgent care for new or worsening concerns.

## 2017-03-09 NOTE — ED Provider Notes (Signed)
[facility] ____________________________________________  Time seen: Approximately 5:00 PM  I have reviewed the triage vital signs and the nursing notes.   HISTORY  Chief Complaint Sore Throat  HPI Garrett Hamilton is a 22 y.o. male presenting for evaluation of sore throat present for the last 1 week.  States has had some intermittent nasal drainage, but no runny nose.  Denies coughing or fevers.  Reports sore throat currently mild, at most moderate.  States pain comes and goes, tends to be worse at night and first thing in the morning.  Denies known sick contacts, but reports he works for the [employer].  Denies associated abdominal discomfort, rash, recent sickness or other recent changes. Reports overall continues to eat and drink well. Denies chest pain, shortness of breath, abdominal pain, or rash. Denies recent sickness. Denies recent antibiotic use.  Denies seasonal allergies.  States has taken some over-the-counter ibuprofen intermittently which does help with discomfort.  Patient does also report at the end of December he may have had exposure to STD.  States that he was sexually active with a partner that had tested positive for gonorrhea.  States he was seen at that time and was tested negative, but reports he wants to make sure that he is still negative, and request to have a throat swab as well as oral sex was involved.  Denies any penile or testicular pain, swelling, discharge, rash or lesions.  Declines any other STD testing.  Past Medical History:  Diagnosis Date  . ADHD     There are no active problems to display for this patient.   Past Surgical History:  Procedure Laterality Date  . TONSILLECTOMY       No current facility-administered medications for this encounter.   Current Outpatient Medications:  .  methylphenidate 36 MG PO CR tablet, Take 36 mg by mouth daily., Disp: , Rfl:  .  cetirizine (ZYRTEC) 10 MG tablet, Take 1 tablet (10 mg total)  by mouth 2 (two) times daily., Disp: 60 tablet, Rfl: 0 .  sodium chloride (OCEAN) 0.65 % SOLN nasal spray, Place 2 sprays into both nostrils every 2 (two) hours while awake., Disp: , Rfl: 0  Allergies Peanuts [peanut oil] and Shellfish allergy  Family History  Problem Relation Age of Onset  . Healthy Mother   . Healthy Father     Social History Social History   Tobacco Use  . Smoking status: Never Smoker  . Smokeless tobacco: Current User  Substance Use Topics  . Alcohol use: Yes    Comment: social  . Drug use: No    Review of Systems Constitutional: No fever/chills ENT: Positive sore throat. Cardiovascular: Denies chest pain. Respiratory: Denies shortness of breath. Gastrointestinal: No abdominal pain.  Genitourinary: Negative for dysuria.  Musculoskeletal: Negative for back pain.  Skin: Negative for rash.  ____________________________________________   PHYSICAL EXAM:  VITAL SIGNS: ED Triage Vitals  Enc Vitals Group     BP 03/09/17 1542 118/73     Pulse Rate 03/09/17 1542 89     Resp 03/09/17 1542 17     Temp 03/09/17 1542 98.6 F (37 C)     Temp Source 03/09/17 1542 Oral     SpO2 03/09/17 1542 99 %     Weight 03/09/17 1539 140 lb (63.5 kg)     Height 03/09/17 1539 5\' 5"  (1.651 m)     Head Circumference --      Peak Flow --      Pain  Score 03/09/17 1539 5     Pain Loc --      Pain Edu? --      Excl. in GC? --     Constitutional: Alert and oriented. Well appearing and in no acute distress. Eyes: Conjunctivae are normal.  Head: Atraumatic. No sinus tenderness to palpation. No swelling. No erythema.  Ears: no erythema, normal TMs bilaterally.   Nose: no nasal congestion.  Mouth/Throat: Mucous membranes are moist. No pharyngeal erythema.  Tonsils surgically absent.  Mild posterior pharyngeal cobblestoning.  No exudate. Neck: No stridor.  No cervical spine tenderness to palpation. Hematological/Lymphatic/Immunilogical: No cervical  lymphadenopathy. Cardiovascular: Normal rate, regular rhythm. Grossly normal heart sounds.  Good peripheral circulation. Respiratory: Normal respiratory effort.  No retractions. No wheezes, rales or rhonchi. Good air movement.  Gastrointestinal: Soft and nontender.  No hepatosplenomegaly palpated. Male: Declined Musculoskeletal: Ambulatory with steady gait. No cervical, thoracic or lumbar tenderness to palpation. Neurologic:  Normal speech and language. No gait instability. Skin:  Skin appears warm, dry and intact. No rash noted. Psychiatric: Mood and affect are normal. Speech and behavior are normal.  ___________________________________________   LABS (all labs ordered are listed, but only abnormal results are displayed)  Labs Reviewed  [facility] STREP SCREEN (NOT [facility])  CULTURE, GROUP A STREP ([facility])  CHLAMYDIA/NGC RT PCR ([facility] ONLY)  CHLAMYDIA/NGC RT PCR ([facility] ONLY)    PROCEDURES Procedures   INITIAL IMPRESSION / ASSESSMENT AND PLAN / ED COURSE  Pertinent labs & imaging results that were available during my care of the patient were reviewed by me and considered in my medical decision making (see chart for details).  Well-appearing patient.  No acute distress.  Quick strep negative, will culture.  Suspect allergic versus viral.  Also discussed mono, patient declined mono testing.  Patient did request GC chlamydia urine as well as throat swab, due to recent potential exposure.  Patient declines any other STD testing.  Encourage patient to take daily over-the-counter antihistamine and over-the-counter ibuprofen as needed.  Encouraged rest, fluids, supportive care.  Discussed follow-up and return parameters.  Discussed follow up with Primary care physician this week. Discussed follow up and return parameters including no resolution or any worsening concerns. Patient verbalized understanding and agreed to plan.    ____________________________________________   FINAL CLINICAL IMPRESSION(S) / ED DIAGNOSES  Final diagnoses:  Pharyngitis, unspecified etiology  Screen for STD (sexually transmitted disease)     ED Discharge Orders    None       Note: This dictation was prepared with Dragon dictation along with smaller phrase technology. Any transcriptional errors that result from this process are unintentional.         Renford DillsMiller, Kirstan Fentress, NP 03/09/17 2106

## 2017-03-09 NOTE — ED Triage Notes (Signed)
Patient complains of sore throat that started one week ago. Patient states that the only other thing else he has noticed is some mucus upon wakening in the morning.

## 2017-03-10 LAB — CHLAMYDIA/NGC RT PCR (ARMC ONLY)
Chlamydia Tr: NOT DETECTED
N gonorrhoeae: NOT DETECTED

## 2017-03-11 LAB — MISC LABCORP TEST (SEND OUT): Labcorp test code: 188698

## 2017-03-12 ENCOUNTER — Telehealth: Payer: Self-pay | Admitting: *Deleted

## 2017-03-12 LAB — CULTURE, GROUP A STREP (THRC)

## 2017-03-18 ENCOUNTER — Encounter: Payer: Self-pay | Admitting: Emergency Medicine

## 2017-03-18 ENCOUNTER — Other Ambulatory Visit: Payer: Self-pay

## 2017-03-18 ENCOUNTER — Ambulatory Visit
Admission: EM | Admit: 2017-03-18 | Discharge: 2017-03-18 | Disposition: A | Discharge status: Home / Self Care | Payer: Federal, State, Local not specified - PPO | Attending: Family Medicine | Admitting: Family Medicine

## 2017-03-18 DIAGNOSIS — J111 Influenza due to unidentified influenza virus with other respiratory manifestations: Secondary | ICD-10-CM

## 2017-03-18 DIAGNOSIS — R69 Illness, unspecified: Secondary | ICD-10-CM | POA: Diagnosis not present

## 2017-03-18 DIAGNOSIS — R05 Cough: Secondary | ICD-10-CM

## 2017-03-18 DIAGNOSIS — R6883 Chills (without fever): Secondary | ICD-10-CM

## 2017-03-18 DIAGNOSIS — M791 Myalgia, unspecified site: Secondary | ICD-10-CM | POA: Diagnosis not present

## 2017-03-18 MED ORDER — BENZONATATE 100 MG PO CAPS: 100.0000 mg | ORAL_CAPSULE | Freq: Three times a day (TID) | ORAL | 0 refills | Status: DC | PRN

## 2017-03-18 MED ORDER — HYDROCOD POLST-CPM POLST ER 10-8 MG/5ML PO SUER: 5.0000 mL | Freq: Every evening | ORAL | 0 refills | Status: DC | PRN

## 2017-03-18 MED ORDER — OSELTAMIVIR PHOSPHATE 75 MG PO CAPS: 75.0000 mg | ORAL_CAPSULE | Freq: Two times a day (BID) | ORAL | 0 refills | Status: DC

## 2017-03-18 NOTE — ED Triage Notes (Signed)
Patient c/o cough, congestion and fever that started yesterday.  Patient took Tylenol about 1 hour ago.

## 2017-03-18 NOTE — ED Provider Notes (Signed)
[facility] ____________________________________________  Time seen: Approximately 6:04 PM  I have reviewed the triage vital signs and the nursing notes.   HISTORY  Chief Complaint Cough and Fever  HPI Garrett Hamilton is a 22 y.o. male presenting for complaints of cough, congestion, chills, body aches, and fevers. States symptoms started last night into today. States fever up to 102.5.  Reports did take Tylenol just prior to arrival.  States cough has been keeping him up last night when trying to sleep.  States has been alternating Tylenol and ibuprofen every few hours.  states believes it was Advil cold and congestion that he took yesterday that his mom given.  Reports some nasal congestion and cough, but overall chills and body aches.  States his girlfriend recently sick with some similar complaints.  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  Denies sore throat.  Overall continues to eat and drink well.  Denies vomiting or diarrhea or abdominal pain.  Denies other complaints. Denies chest pain, shortness of breath, abdominal pain, dysuria, or rash. Denies recent sickness. Denies recent antibiotic use.    Past Medical History:  Diagnosis Date  . ADHD     There are no active problems to display for this patient.   Past Surgical History:  Procedure Laterality Date  . TONSILLECTOMY       No current facility-administered medications for this encounter.   Current Outpatient Medications:  .  methylphenidate 36 MG PO CR tablet, Take 36 mg by mouth daily., Disp: , Rfl:  .  benzonatate (TESSALON PERLES) 100 MG capsule, Take 1 capsule (100 mg total) by mouth 3 (three) times daily as needed for cough., Disp: 15 capsule, Rfl: 0 .  chlorpheniramine-HYDROcodone (TUSSIONEX PENNKINETIC ER) 10-8 MG/5ML SUER, Take 5 mLs by mouth at bedtime as needed for cough. do not drive or operate machinery while taking as can cause drowsiness., Disp: 60 mL, Rfl: 0 .  oseltamivir  (TAMIFLU) 75 MG capsule, Take 1 capsule (75 mg total) by mouth every 12 (twelve) hours., Disp: 10 capsule, Rfl: 0 .  sodium chloride (OCEAN) 0.65 % SOLN nasal spray, Place 2 sprays into both nostrils every 2 (two) hours while awake., Disp: , Rfl: 0  Allergies Peanuts [peanut oil] and Shellfish allergy  Family History  Problem Relation Age of Onset  . Healthy Mother   . Healthy Father     Social History Social History   Tobacco Use  . Smoking status: Never Smoker  . Smokeless tobacco: Current User  Substance Use Topics  . Alcohol use: Yes    Comment: social  . Drug use: No    Review of Systems Constitutional: as above.  ENT: No sore throat. Cardiovascular: Denies chest pain. Respiratory: Denies shortness of breath. Gastrointestinal: No abdominal pain.  Musculoskeletal: Negative for back pain. Skin: Negative for rash.   ____________________________________________   PHYSICAL EXAM:  VITAL SIGNS: ED Triage Vitals  Enc Vitals Group     BP 03/18/17 1743 104/74     Pulse Rate 03/18/17 1743 (!) 103     Resp 03/18/17 1743 16     Temp 03/18/17 1743 (!) 102.6 F (39.2 C)     Temp Source 03/18/17 1743 Oral     SpO2 03/18/17 1743 98 %     Weight 03/18/17 1741 140 lb (63.5 kg)     Height 03/18/17 1741 5\' 5"  (1.651 m)     Head Circumference --      Peak Flow --  Pain Score 03/18/17 1740 6     Pain Loc --      Pain Edu? --      Excl. in GC? --     Constitutional: Alert and oriented. Well appearing and in no acute distress. Eyes: Conjunctivae are normal.  Head: Atraumatic. No sinus tenderness to palpation. No swelling. No erythema.  Ears: no erythema, normal TMs bilaterally.   Nose:Nasal congestion with clear rhinorrhea  Mouth/Throat: Mucous membranes are moist. No pharyngeal erythema. No tonsillar swelling or exudate.  Neck: No stridor.  No cervical spine tenderness to palpation. Hematological/Lymphatic/Immunilogical: No cervical lymphadenopathy. Cardiovascular:  Normal rate, regular rhythm. Grossly normal heart sounds.  Good peripheral circulation. Respiratory: Normal respiratory effort.  No retractions. No wheezes, rales or rhonchi. Good air movement.  Musculoskeletal: Ambulatory with steady gait. No cervical, thoracic or lumbar tenderness to palpation. Neurologic:  Normal speech and language. No gait instability. Skin:  Skin appears warm, dry and intact. No rash noted. Psychiatric: Mood and affect are normal. Speech and behavior are normal.  ___________________________________________   LABS (all labs ordered are listed, but only abnormal results are displayed)  Labs Reviewed - No data to display ____________________________________________   PROCEDURES Procedures    INITIAL IMPRESSION / ASSESSMENT AND PLAN / ED COURSE  Pertinent labs & imaging results that were available during my care of the patient were reviewed by me and considered in my medical decision making (see chart for details).  Well-appearing patient.  No acute distress.  Suspect influenza.  Discussed treatment options with patient.  Will treat with Tamiflu, PRN Tessalon Perles and as needed Tussionex.  Encourage rest, fluids, supportive care.Discussed indication, risks and benefits of medications with patient.  Discussed follow up with Primary care physician this week. Discussed follow up and return parameters including no resolution or any worsening concerns. Patient verbalized understanding and agreed to plan.   ____________________________________________   FINAL CLINICAL IMPRESSION(S) / ED DIAGNOSES  Final diagnoses:  Influenza-like illness     ED Discharge Orders        Ordered    oseltamivir (TAMIFLU) 75 MG capsule  Every 12 hours     03/18/17 1810    benzonatate (TESSALON PERLES) 100 MG capsule  3 times daily PRN     03/18/17 1810    chlorpheniramine-HYDROcodone (TUSSIONEX PENNKINETIC ER) 10-8 MG/5ML SUER  At bedtime PRN     03/18/17 1810       Note:  This dictation was prepared with Dragon dictation along with smaller phrase technology. Any transcriptional errors that result from this process are unintentional.         Renford DillsMiller, Kasen Adduci, NP 03/18/17 1913

## 2017-03-18 NOTE — Discharge Instructions (Signed)
Take medication as prescribed. Rest. Drink plenty of fluids.  ° °Follow up with your primary care physician this week as needed. Return to Urgent care for new or worsening concerns.  ° °

## 2018-06-09 DIAGNOSIS — F900 Attention-deficit hyperactivity disorder, predominantly inattentive type: Secondary | ICD-10-CM | POA: Insufficient documentation

## 2018-10-02 ENCOUNTER — Ambulatory Visit
Admission: EM | Admit: 2018-10-02 | Discharge: 2018-10-02 | Disposition: A | Discharge status: Home / Self Care | Payer: Federal, State, Local not specified - PPO

## 2018-10-02 ENCOUNTER — Other Ambulatory Visit: Payer: Self-pay

## 2018-10-02 ENCOUNTER — Encounter: Payer: Self-pay | Admitting: Emergency Medicine

## 2018-10-02 DIAGNOSIS — K649 Unspecified hemorrhoids: Secondary | ICD-10-CM | POA: Diagnosis not present

## 2018-10-02 DIAGNOSIS — K6289 Other specified diseases of anus and rectum: Secondary | ICD-10-CM | POA: Diagnosis not present

## 2018-10-02 DIAGNOSIS — L988 Other specified disorders of the skin and subcutaneous tissue: Secondary | ICD-10-CM | POA: Diagnosis not present

## 2018-10-02 MED ORDER — HYDROCORTISONE (PERIANAL) 2.5 % EX CREA: 1.0000 "application " | TOPICAL_CREAM | Freq: Two times a day (BID) | CUTANEOUS | 0 refills | Status: DC

## 2018-10-02 NOTE — ED Triage Notes (Signed)
Patient c/o rectal pain and possible hemrroid that has been there for over a week. Patient denies rectal bleeding. Patient c/o abscess between his buttock for the past 2 years.

## 2018-10-02 NOTE — ED Provider Notes (Signed)
[place], [place]   Name: Garrett Hamilton DOB: 17-Dec-1995 MRN: [id number] CSN: [id number] PCP: Patient, No Pcp Per  Arrival date and time:  10/02/18 1011  Chief Complaint:  Rectal Pain and Hemorrhoids   NOTE: Prior to seeing the patient today, I have reviewed the triage nursing documentation and vital signs. Clinical staff has updated patient's PMH/PSHx, current medication list, and drug allergies/intolerances to ensure comprehensive history available to assist in medical decision making.   History:   HPI: Garrett Hamilton is a 23 y.o. male who presents today with complaints of rectal pain that has worsened over the course of the last 1.5 weeks. Patient with PMH (+) for hemorrhoids; feels the same. Patient denies any associated rectal bleeding. Patient has been using "hemorrhoid wipes" and Preparation H suppositories without any perceived improvement in symptoms. Additionally, patient has a known pilonidal cyst that he has been advised will require surgical intervention. [facility] and only scant drainage was able to able to be produced. Patient asking to have area reassessed for possible infection. Patient denies fever or drainage. Area has improved (not as tender) since it was opened in the past.   Past Medical History:  Diagnosis Date   ADHD     Past Surgical History:  Procedure Laterality Date   TONSILLECTOMY      Family History  Problem Relation Age of Onset   Healthy Mother    Healthy Father     Social History   Tobacco Use   Smoking status: Never Smoker   Smokeless tobacco: Current User  Substance Use Topics   Alcohol use: Yes    Comment: social   Drug use: No    There are no active problems to display for this patient.   Home Medications:    Current Meds  Medication Sig   VYVANSE 20 MG capsule     Allergies:   Peanuts [peanut oil] and Shellfish allergy  Review of Systems (ROS): Review of Systems  Constitutional: Negative for  chills and fever.  Respiratory: Negative for cough and shortness of breath.   Cardiovascular: Negative for chest pain and palpitations.  Gastrointestinal: Positive for constipation and rectal pain. Negative for anal bleeding, blood in stool, diarrhea, nausea and vomiting.  All other systems reviewed and are negative.    Vital Signs: Today's Vitals   10/02/18 1021 10/02/18 1024 10/02/18 1042  BP:  117/71   Pulse:  68   Resp:  14   Temp:  98.2 F (36.8 C)   TempSrc:  Oral   SpO2:  100%   Weight: 135 lb (61.2 kg)    Height: 5\' 6"  (1.676 m)    PainSc: 3   3     Physical Exam: Physical Exam  Constitutional: He is oriented to person, place, and time and well-developed, well-nourished, and in no distress.  HENT:  Head: Normocephalic and atraumatic.  Mouth/Throat: Mucous membranes are normal.  Eyes: Pupils are equal, round, and reactive to light. EOM are normal.  Cardiovascular: Normal rate, regular rhythm, normal heart sounds and intact distal pulses. Exam reveals no gallop and no friction rub.  No murmur heard. Pulmonary/Chest: Effort normal and breath sounds normal. No respiratory distress. He has no wheezes. He has no rales.  Genitourinary: Rectum:     Tenderness and external hemorrhoid (on anal verge; TTP; mild to moderately thrombosed) present.   Neurological: He is alert and oriented to person, place, and time. Gait normal. GCS score is 15.  Skin: Skin  is warm and dry. No rash noted.  Pilonidal cyst to RIGHT buttock; scarring from previous I&D noted. No fluctuance. No increased erythema, swelling, or warmth. No drainage. Minimal tenderness extending medially culminating at the superior aspect of the natal cleft.   Psychiatric: Mood, memory, affect and judgment normal.  Nursing note and vitals reviewed.   Urgent Care Treatments / Results:   LABS: PLEASE NOTE: all labs that were ordered this encounter are listed, however only abnormal results are displayed. Labs Reviewed -  No data to display  EKG: -None  RADIOLOGY: No results found.  PROCEDURES: Procedures  MEDICATIONS RECEIVED THIS VISIT: Medications - No data to display  PERTINENT CLINICAL COURSE NOTES/UPDATES:   Initial Impression / Assessment and Plan / Urgent Care Course:  Pertinent labs & imaging results that were available during my care of the patient were personally reviewed by me and considered in my medical decision making (see lab/imaging section of note for values and interpretations).  Garrett Hamilton is a 23 y.o. male who [facility] today with complaints of peri-rectal pain and pilonidal cyst.   Patient is well appearing overall in clinic today. He does not appear to be in any acute distress. Presenting symptoms (see HPI) and exam as documented above. Cyst does not appear to be infected. [facility] with only minimal drainage. Patient does not wish to have area opened again. Dicussed warm compresses to help inflammation and promote drainage. Ultimately, he needs to see surgery for further definitive management of his pilonidal disease. Name and office contact information provided on today's AVS for Dr. Sung AmabileIsami Sakai. Patient advised the he will need to contact the office to schedule an appointment to be seen.   Regarding his per-rectal pain. There is a thrombosed hemorrhoid noted. He has had hemorrhoids in the past that resolved without intervention. Pain and swelling has been persistent x 1 week despite OTC interventions. Discussed prescription strength intervention versus attempting to drain in clinic. Discussed with patient the associated risks, which include bleeding. Patient electing for conservative management. He plans on seeing surgery for his pilonidal disease, I encouraged him to discuss management of his hemorrhoid during his appointment as well. In the interim, will treat with Proctocare-HC topical. Patient educated on the use of sitz baths to help  with associated irritation. May also use Tylenol and/or Ibuprofen as needed for pain.  I have reviewed the follow up and strict return precautions for any new or worsening symptoms. Patient is aware of symptoms that would be deemed urgent/emergent, and would thus require further evaluation either here or in the emergency department. At the time of discharge, he verbalized understanding and consent with the discharge plan as it was reviewed with him. All questions were fielded by provider and/or clinic staff prior to patient discharge.    Final Clinical Impressions / Urgent Care Diagnoses:   Final diagnoses:  Pilonidal disease  Hemorrhoids, unspecified hemorrhoid type  Rectal pain    New Prescriptions:  [place] Controlled Substance Registry consulted? Not Applicable  Meds ordered this encounter  Medications   hydrocortisone (PROCTOCARE-HC) 2.5 % rectal cream    Sig: Place 1 application rectally 2 (two) times daily.    Dispense:  30 g    Refill:  0    Recommended Follow up Care:  Patient encouraged to follow up with the following provider within the specified time frame, or sooner as dictated by the severity of his symptoms. As always, he was instructed that  for any urgent/emergent care needs, he should seek care either here or in the emergency department for more immediate evaluation.  Follow-up Information    Call  [place], Isami, DO.   Specialty: Surgery Why: Need to discuss options for management of pilonidial cyst and hemorrhoid. Contact information: [address] [place] [address] [phone]         NOTE: This note was prepared using Dragon dictation software along with smaller phrase technology. Despite my best ability to proofread, there is the potential that transcriptional errors may still occur from this process, and are completely unintentional.    Karen Kitchens, NP 10/03/18 1216

## 2018-10-02 NOTE — Discharge Instructions (Addendum)
It was very nice seeing you today in clinic. Thank you for entrusting me with your care.   Sitz baths with help with hemorrhoid. Please utilize the medications that we discussed. Your prescriptions have been called in to your pharmacy. Apply warm compresses to pilonidal cyst to help promote drainage. Ultimately, you are going to need surgical intervention.   Make arrangements to follow up with surgery. I have provided you the name and office contact information for an excellent local provider. If your symptoms/condition worsens, please seek follow up care either here or in the ER. Please remember, our [place] providers are "right here with you" when you need [place].   Again, it was my pleasure to take care of you today. Thank you for choosing our clinic. I hope that you start to feel better quickly.   Honor Loh, MSN, APRN, FNP-C, CEN Advanced Practice Provider [facility]

## 2018-11-24 ENCOUNTER — Other Ambulatory Visit: Payer: Self-pay

## 2018-11-24 DIAGNOSIS — Z0289 Encounter for other administrative examinations: Secondary | ICD-10-CM

## 2018-12-03 LAB — POCT URINALYSIS DIPSTICK
Bilirubin, UA: NEGATIVE
Blood, UA: NEGATIVE
Glucose, UA: NEGATIVE
Ketones, UA: NEGATIVE
Leukocytes, UA: NEGATIVE
Nitrite, UA: NEGATIVE
Protein, UA: NEGATIVE
Spec Grav, UA: 1.02 (ref 1.010–1.025)
Urobilinogen, UA: 0.2 E.U./dL
pH, UA: 7 (ref 5.0–8.0)

## 2019-01-11 ENCOUNTER — Other Ambulatory Visit: Payer: Self-pay

## 2019-01-11 ENCOUNTER — Ambulatory Visit: Payer: Federal, State, Local not specified - PPO | Admitting: Occupational Medicine

## 2019-01-11 ENCOUNTER — Encounter: Payer: Self-pay | Admitting: Occupational Medicine

## 2019-01-11 VITALS — BP 110/60 | HR 95 | Temp 98.3°F | Resp 12 | Ht 65.0 in | Wt 140.0 lb

## 2019-01-11 DIAGNOSIS — Z0000 Encounter for general adult medical examination without abnormal findings: Secondary | ICD-10-CM

## 2019-04-19 ENCOUNTER — Other Ambulatory Visit: Payer: Self-pay

## 2019-04-19 ENCOUNTER — Ambulatory Visit: Payer: Self-pay

## 2019-04-19 DIAGNOSIS — Z0000 Encounter for general adult medical examination without abnormal findings: Secondary | ICD-10-CM

## 2019-04-19 LAB — POCT URINALYSIS DIPSTICK
Bilirubin, UA: NEGATIVE
Blood, UA: NEGATIVE
Glucose, UA: NEGATIVE
Ketones, UA: NEGATIVE
Leukocytes, UA: NEGATIVE
Nitrite, UA: NEGATIVE
Protein, UA: NEGATIVE
Spec Grav, UA: 1.015 (ref 1.010–1.025)
Urobilinogen, UA: 0.2 E.U./dL
pH, UA: 8 (ref 5.0–8.0)

## 2019-04-19 NOTE — Progress Notes (Signed)
Patient comes in today for pre physical labs and EKG. Patient is scheduled with Dr.Hunt on 05/03/19.

## 2019-04-20 LAB — CMP12+LP+TP+TSH+6AC+PSA+CBC…
ALT: 13 IU/L (ref 0–44)
AST: 23 IU/L (ref 0–40)
Albumin/Globulin Ratio: 2.2 (ref 1.2–2.2)
Alkaline Phosphatase: 109 IU/L (ref 39–117)
BUN/Creatinine Ratio: 15 (ref 9–20)
BUN: 14 mg/dL (ref 6–20)
Basophils Absolute: 0.1 10*3/uL (ref 0.0–0.2)
Basos: 1 %
Bilirubin Total: 0.7 mg/dL (ref 0.0–1.2)
Calcium: 10.2 mg/dL (ref 8.7–10.2)
Chloride: 100 mmol/L (ref 96–106)
Chol/HDL Ratio: 2.1 ratio (ref 0.0–5.0)
Cholesterol, Total: 157 mg/dL (ref 100–199)
Creatinine, Ser: 0.91 mg/dL (ref 0.76–1.27)
Eos: 11 %
Estimated CHD Risk: <0.5 times avg. (ref 0.0–1.0)
Free Thyroxine Index: 1.9 (ref 1.2–4.9)
GFR calc Af Amer: 137 mL/min/{1.73_m2} (ref >59)
GFR calc non Af Amer: 118 mL/min/{1.73_m2} (ref >59)
GGT: 16 IU/L (ref 0–65)
Globulin, Total: 2.4 g/dL (ref 1.5–4.5)
Glucose: 81 mg/dL (ref 65–99)
HDL: 75 mg/dL (ref >39)
Hemoglobin: 14.9 g/dL (ref 13.0–17.7)
Iron: 93 ug/dL (ref 38–169)
LDH: 192 IU/L (ref 121–224)
LDL Chol Calc (NIH): 70 mg/dL (ref 0–99)
Lymphocytes Absolute: 2.1 10*3/uL (ref 0.7–3.1)
MCHC: 34.3 g/dL (ref 31.5–35.7)
MCV: 86 fL (ref 79–97)
Monocytes Absolute: 0.4 10*3/uL (ref 0.1–0.9)
Monocytes: 5 %
Phosphorus: 3.3 mg/dL (ref 2.8–4.1)
Prostate Specific Ag, Serum: 0.5 ng/mL (ref 0.0–4.0)
RBC: 5.08 x10E6/uL (ref 4.14–5.80)
TSH: 1.18 u[IU]/mL (ref 0.450–4.500)
Total Protein: 7.6 g/dL (ref 6.0–8.5)
Uric Acid: 6.2 mg/dL (ref 3.8–8.4)
VLDL Cholesterol Cal: 12 mg/dL (ref 5–40)
WBC: 7.7 10*3/uL (ref 3.4–10.8)

## 2019-04-20 LAB — CMP12+LP+TP+TSH+6AC+PSA+CBC?
Albumin: 5.2 g/dL (ref 4.1–5.2)
EOS (ABSOLUTE): 0.8 10*3/uL — ABNORMAL HIGH (ref 0.0–0.4)
Hematocrit: 43.5 % (ref 37.5–51.0)
Immature Grans (Abs): 0 10*3/uL (ref 0.0–0.1)
Immature Granulocytes: 0 %
Lymphs: 27 %
MCH: 29.3 pg (ref 26.6–33.0)
Neutrophils Absolute: 4.3 10*3/uL (ref 1.4–7.0)
Neutrophils: 56 %
Platelets: 392 10*3/uL (ref 150–450)
Potassium: 4.5 mmol/L (ref 3.5–5.2)
RDW: 11.9 % (ref 11.6–15.4)
Sodium: 139 mmol/L (ref 134–144)
T3 Uptake Ratio: 27 % (ref 24–39)
T4, Total: 6.9 ug/dL (ref 4.5–12.0)
Triglycerides: 59 mg/dL (ref 0–149)

## 2019-04-21 LAB — QUANTIFERON-TB GOLD PLUS
QuantiFERON Mitogen Value: 9.57 IU/mL
QuantiFERON Nil Value: 0.09 IU/mL
QuantiFERON TB1 Ag Value: 0.16 IU/mL
QuantiFERON TB2 Ag Value: 0.19 IU/mL
QuantiFERON-TB Gold Plus: NEGATIVE

## 2019-05-03 ENCOUNTER — Other Ambulatory Visit: Payer: Self-pay

## 2019-05-03 ENCOUNTER — Ambulatory Visit: Payer: Self-pay | Admitting: Occupational Medicine

## 2019-05-03 ENCOUNTER — Encounter: Payer: Self-pay | Admitting: Occupational Medicine

## 2019-05-03 VITALS — BP 125/82 | HR 84 | Temp 97.7°F | Resp 12 | Ht 66.0 in | Wt 137.0 lb

## 2019-05-03 DIAGNOSIS — Z0289 Encounter for other administrative examinations: Secondary | ICD-10-CM

## 2019-05-03 NOTE — Progress Notes (Signed)
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

## 2020-05-03 ENCOUNTER — Ambulatory Visit: Payer: Self-pay

## 2020-05-03 ENCOUNTER — Other Ambulatory Visit: Payer: Self-pay

## 2020-05-03 DIAGNOSIS — Z01818 Encounter for other preprocedural examination: Secondary | ICD-10-CM

## 2020-05-03 LAB — POCT URINALYSIS DIPSTICK
Bilirubin, UA: NEGATIVE
Blood, UA: POSITIVE
Glucose, UA: NEGATIVE
Ketones, UA: NEGATIVE
Leukocytes, UA: NEGATIVE
Nitrite, UA: NEGATIVE
Protein, UA: NEGATIVE
Spec Grav, UA: 1.02 (ref 1.010–1.025)
Urobilinogen, UA: 0.2 E.U./dL
pH, UA: 6 (ref 5.0–8.0)

## 2020-05-03 NOTE — Progress Notes (Signed)
Pt scheduled to complete physical 05/15/20. CL,RMA

## 2020-05-04 LAB — CMP12+LP+TP+TSH+6AC+CBC/D/PLT
ALT: 13 IU/L (ref 0–44)
AST: 24 IU/L (ref 0–40)
Albumin/Globulin Ratio: 1.9 (ref 1.2–2.2)
Albumin: 5 g/dL (ref 4.1–5.2)
Alkaline Phosphatase: 102 IU/L (ref 44–121)
BUN/Creatinine Ratio: 16 (ref 9–20)
BUN: 15 mg/dL (ref 6–20)
Basophils Absolute: 0 10*3/uL (ref 0.0–0.2)
Basos: 1 %
Bilirubin Total: 1 mg/dL (ref 0.0–1.2)
Calcium: 9.7 mg/dL (ref 8.7–10.2)
Chloride: 98 mmol/L (ref 96–106)
Chol/HDL Ratio: 2 ratio (ref 0.0–5.0)
Cholesterol, Total: 150 mg/dL (ref 100–199)
Creatinine, Ser: 0.91 mg/dL (ref 0.76–1.27)
EOS (ABSOLUTE): 0.6 10*3/uL — ABNORMAL HIGH (ref 0.0–0.4)
Eos: 9 %
Estimated CHD Risk: <0.5 times avg. (ref 0.0–1.0)
Free Thyroxine Index: 2.3 (ref 1.2–4.9)
GGT: 19 IU/L (ref 0–65)
Globulin, Total: 2.6 g/dL (ref 1.5–4.5)
Glucose: 89 mg/dL (ref 65–99)
HDL: 74 mg/dL (ref >39)
Hematocrit: 43.1 % (ref 37.5–51.0)
Hemoglobin: 14.1 g/dL (ref 13.0–17.7)
Immature Grans (Abs): 0 10*3/uL (ref 0.0–0.1)
Immature Granulocytes: 0 %
Iron: 108 ug/dL (ref 38–169)
LDH: 167 IU/L (ref 121–224)
LDL Chol Calc (NIH): 62 mg/dL (ref 0–99)
Lymphocytes Absolute: 1.8 10*3/uL (ref 0.7–3.1)
Lymphs: 28 %
MCH: 28.8 pg (ref 26.6–33.0)
MCHC: 32.7 g/dL (ref 31.5–35.7)
MCV: 88 fL (ref 79–97)
Monocytes Absolute: 0.4 10*3/uL (ref 0.1–0.9)
Monocytes: 7 %
Neutrophils Absolute: 3.6 10*3/uL (ref 1.4–7.0)
Neutrophils: 55 %
Phosphorus: 3.1 mg/dL (ref 2.8–4.1)
Platelets: 334 10*3/uL (ref 150–450)
Potassium: 4.4 mmol/L (ref 3.5–5.2)
RBC: 4.89 x10E6/uL (ref 4.14–5.80)
RDW: 12.1 % (ref 11.6–15.4)
Sodium: 139 mmol/L (ref 134–144)
T3 Uptake Ratio: 29 % (ref 24–39)
T4, Total: 8 ug/dL (ref 4.5–12.0)
TSH: 0.852 u[IU]/mL (ref 0.450–4.500)
Total Protein: 7.6 g/dL (ref 6.0–8.5)
Triglycerides: 69 mg/dL (ref 0–149)
Uric Acid: 5.1 mg/dL (ref 3.8–8.4)
VLDL Cholesterol Cal: 14 mg/dL (ref 5–40)
WBC: 6.4 10*3/uL (ref 3.4–10.8)
eGFR: 120 mL/min/{1.73_m2} (ref >59)

## 2020-05-16 ENCOUNTER — Encounter: Payer: Federal, State, Local not specified - PPO | Admitting: Emergency Medicine

## 2020-05-28 ENCOUNTER — Encounter: Payer: Self-pay | Admitting: Physician Assistant

## 2020-05-28 ENCOUNTER — Ambulatory Visit: Payer: Self-pay | Admitting: Physician Assistant

## 2020-05-28 ENCOUNTER — Other Ambulatory Visit: Payer: Self-pay

## 2020-05-28 VITALS — BP 120/78 | HR 93 | Temp 98.1°F | Resp 12 | Ht 66.0 in | Wt 136.0 lb

## 2020-05-28 DIAGNOSIS — Z0000 Encounter for general adult medical examination without abnormal findings: Secondary | ICD-10-CM

## 2020-05-28 DIAGNOSIS — L0591 Pilonidal cyst without abscess: Secondary | ICD-10-CM

## 2020-05-28 NOTE — Progress Notes (Signed)
   Subjective: Annual firefighter exam    Patient ID: Garrett Hamilton, male    DOB: 02/28/1995, 25 y.o.   MRN: [id number]  HPI Patient presents annual firefighter exam.  Patient was concerned for history of pilonidal cyst to the right buttocks.   Review of Systems    Negative septal complaint Objective:   Physical Exam No acute distress.  Temperature 98.1, pulse 93, respiration 12, BP is 120/78, patient is 93% O2 sat on room air. HEENT is unremarkable.  Neck is supple full active [place] or bruits.  Lungs are clear to auscultation.  Heart regular rate and rhythm.  Abdomen with negative HSM, normoactive bowel sounds, soft, nontender to palpation. No obvious deformity to the upper or lower extremities.  Patient has full and equal range of motion of the upper and lower extremities. No obvious cervical or lumbar spine deformity.  Patient has full and equal range of motion cervical lumbar spine.  Cranial nerves II through XII grossly intact.  Patient has a nonfluctuant nodule lesion right buttocks.       Assessment & Plan: Well exam.  Discussed no acute findings on labs results with patient.  Patient will be given a consult to surgical clinic for further evaluation and excision of pilonidal cyst.

## 2020-05-29 NOTE — Addendum Note (Signed)
Addended by: Gardner Candle on: 05/29/2020 08:23 AM   Modules accepted: Orders

## 2020-07-03 ENCOUNTER — Other Ambulatory Visit: Payer: Self-pay | Admitting: General Surgery

## 2020-07-03 NOTE — Progress Notes (Signed)
Subjective:     Patient ID: Garrett Hamilton is a 25 y.o. male.  HPI  The following portions of the patient's history were reviewed and updated as appropriate.  This an established patient is here today for: office visit. Patient is here today for evaluation of a pilonidal cyst. He has been referred by Nona Dell, PA. He states it has been present for 2-3 years and it comes and goes.       Chief Complaint  Patient presents with  . Pilonidal Cyst     BP 100/62   Pulse 83   Temp 36.8 C (98.3 F)   Ht 167.6 cm (5\' 6" )   Wt 59.4 kg (131 lb)   SpO2 98%   BMI 21.14 kg/m       Past Medical History:  Diagnosis Date  . ADHD   . Allergic state           Past Surgical History:  Procedure Laterality Date  . TONSILLECTOMY         Social History          Socioeconomic History  . Marital status: Single  Tobacco Use  . Smoking status: Never Smoker  . Smokeless tobacco: Current User    Types: Snuff  Vaping Use  . Vaping Use: Never used  Substance and Sexual Activity  . Alcohol use: Yes    Comment: occasionally  . Drug use: No  . Sexual activity: Not Currently  Social History Narrative   Father smokes in the home on occasion. 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 Peanut Anaphylaxis  . Shellfish Containing Products Hives    Current Medications        Current Outpatient Medications  Medication Sig Dispense Refill  . lisdexamfetamine (VYVANSE) 20 MG capsule Take 1 capsule (20 mg total) by mouth once daily for 30 days 30 capsule 0  . lisdexamfetamine (VYVANSE) 20 MG capsule Take 1 capsule (20 mg total) by mouth once daily for 30 days 30 capsule 0  . [START ON 07/14/2020] lisdexamfetamine (VYVANSE) 20 MG capsule Take 1 capsule (20 mg total) by mouth once daily for 30 days 30 capsule 0   No current facility-administered medications for  this visit.           Family History  Problem Relation Age of Onset  . Short stature Father   . ADD / ADHD Father   . ADD / ADHD Brother   . Diabetes type I Neg Hx   . Thyroid disease Neg Hx   . Diabetes type II Neg Hx   . Late puberty Neg Hx         Review of Systems  Constitutional: Negative for chills and fever.  Respiratory: Negative for cough.        Objective:   Physical Exam Constitutional:      Appearance: Normal appearance.  Cardiovascular:     Rate and Rhythm: Normal rate and regular rhythm.     Pulses: Normal pulses.     Heart sounds: Normal heart sounds.  Pulmonary:     Effort: Pulmonary effort is normal.     Breath sounds: Normal breath sounds.  Abdominal:     General: Abdomen is flat.     Palpations: Abdomen is soft.  Skin:         Comments:  Photo in media Thickening near the right side of the natal cleft about 3-4 cm from the anus consistent with prior inflammatory changes.  Prior I&D site on the right gluteal area noted.  No evidence of active inflammation.  Neurological:     Mental Status: He is alert and oriented to person, place, and time.  Psychiatric:        Mood and Affect: Mood normal.        Behavior: Behavior normal.    Labs and Radiology:   May 03, 2020 [facility] [place] laboratory:  CBC showed a hemoglobin of 14.1, MCV of 88, white blood cell count of 6400 with normal differential, platelet count 334,000.  Comprehensive metabolic panel showed a creatinine of 0.9.  Normal electrolytes.  Blood sugar of 89.    Assessment:     Pilonidal cyst with prior abscess.    Plan:     Indications for surgical excision reviewed.  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  He reports he can do light duty.  Anticipate this will be comfortable after week 1.  Likely return to driving week 2-3. Patient to be scheduled for surgery at a convenient date.     This note is partially prepared by Wendall Stade,  CMA acting as a scribe in the presence of Dr. Donnalee Curry, MD.   The documentation recorded by the scribe accurately reflects the service I personally performed and the decisions made by me.   Earline Mayotte, MD FACS

## 2020-07-06 ENCOUNTER — Other Ambulatory Visit: Payer: Self-pay

## 2020-07-06 ENCOUNTER — Other Ambulatory Visit
Admission: RE | Admit: 2020-07-06 | Discharge: 2020-07-06 | Disposition: A | Discharge status: Home / Self Care | Payer: Federal, State, Local not specified - PPO | Source: Ambulatory Visit | Attending: General Surgery | Admitting: General Surgery

## 2020-07-06 HISTORY — DX: COVID-19: U07.1

## 2020-07-06 NOTE — Patient Instructions (Addendum)
Your procedure is scheduled on: 07/11/20 - Wednesday Report to the Registration Desk on the 1st floor of the Medical Mall. To find out your arrival time, please call [phone] between 1PM - 3PM on: 07/10/20 - Tuesday  REMEMBER: Instructions that are not followed completely may result in serious medical risk, up to and including death; or upon the discretion of your surgeon and anesthesiologist your surgery may need to be rescheduled.  Do not eat food after midnight the night before surgery.  No gum chewing, lozengers or hard candies.  You may however, drink CLEAR liquids up to 2 hours before you are scheduled to arrive for your surgery. Do not drink anything within 2 hours of your scheduled arrival time.  Clear liquids include: - water  - apple juice without pulp - gatorade (not RED, PURPLE, OR BLUE) - black coffee or tea (Do NOT add milk or creamers to the coffee or tea) Do NOT drink anything that is not on this list.  TAKE THESE MEDICATIONS THE MORNING OF SURGERY WITH A SIP OF Water: None  One week prior to surgery: Stop Anti-inflammatories (NSAIDS) such as Advil, Aleve, Ibuprofen, Motrin, Naproxen, Naprosyn and Aspirin based products such as Excedrin, Goodys Powder, BC Powder.  Stop ANY OVER THE COUNTER supplements until after surgery.  You may however, continue to take Tylenol if needed for pain up until the day of surgery.  No Alcohol for 24 hours before or after surgery.  No Smoking including e-cigarettes for 24 hours prior to surgery.  No chewable tobacco products for at least 6 hours prior to surgery.  No nicotine patches on the day of surgery.  Do not use any "recreational" drugs for at least a week prior to your surgery.  Please be advised that the combination of cocaine and anesthesia may have negative outcomes, up to and including death. If you test positive for cocaine, your surgery will be cancelled.  On the morning of surgery brush your teeth with toothpaste  and water, you may rinse your mouth with mouthwash if you wish. Do not swallow any toothpaste or mouthwash.  Do not wear jewelry, make-up, hairpins, clips or nail polish.  Do not wear lotions, powders, or perfumes.   Do not shave body from the neck down 48 hours prior to surgery just in case you cut yourself which could leave a site for infection.  Also, freshly shaved skin may become irritated if using the CHG soap.  Contact lenses, hearing aids and dentures may not be worn into surgery.  Do not bring valuables to the hospital. [facility] is not responsible for any missing/lost belongings or valuables.   Notify your doctor if there is any change in your medical condition (cold, fever, infection).  Wear comfortable clothing (specific to your surgery type) to the hospital.  Plan for stool softeners for home use; pain medications have a tendency to cause constipation. You can also help prevent constipation by eating foods high in fiber such as fruits and vegetables and drinking plenty of fluids as your diet allows.  After surgery, you can help prevent lung complications by doing breathing exercises.  Take deep breaths and cough every 1-2 hours. Your doctor may order a device called an Incentive Spirometer to help you take deep breaths. When coughing or sneezing, hold a pillow firmly against your incision with both hands. This is called "splinting." Doing this helps protect your incision. It also decreases belly discomfort.  If you are being admitted to the hospital  overnight, leave your suitcase in the car. After surgery it may be brought to your room.  If you are being discharged the day of surgery, you will not be allowed to drive home. You will need a responsible adult (18 years or older) to drive you home and stay with you that night.   If you are taking public transportation, you will need to have a responsible adult (18 years or older) with you. Please confirm with your physician  that it is acceptable to use public transportation.   Please call the Pre-admissions Testing Dept. at [phone] if you have any questions about these instructions.  Surgery Visitation Policy:  Patients undergoing a surgery or procedure may have one family member or support person with them as long as that person is not COVID-19 positive or experiencing its symptoms.  That person may remain in the waiting area during the procedure.  Inpatient Visitation:    Visiting hours are 7 a.m. to 8 p.m. Inpatients will be allowed two visitors daily. The visitors may change each day during the patient's stay. No visitors under the age of 40. Any visitor under the age of 62 must be accompanied by an adult. The visitor must pass COVID-19 screenings, use hand sanitizer when entering and exiting the patient's room and wear a mask at all times, including in the patient's room. Patients must also wear a mask when staff or their visitor are in the room. Masking is required regardless of vaccination status.

## 2020-07-11 ENCOUNTER — Ambulatory Visit: Payer: Federal, State, Local not specified - PPO | Admitting: Anesthesiology

## 2020-07-11 ENCOUNTER — Other Ambulatory Visit: Payer: Self-pay

## 2020-07-11 ENCOUNTER — Ambulatory Visit
Admission: RE | Admit: 2020-07-11 | Discharge: 2020-07-11 | Disposition: A | Discharge status: Home / Self Care | Payer: Federal, State, Local not specified - PPO | Attending: General Surgery | Admitting: General Surgery

## 2020-07-11 ENCOUNTER — Encounter
Admission: RE | Disposition: A | Discharge status: Home / Self Care | Payer: Self-pay | Source: Home / Self Care | Attending: General Surgery

## 2020-07-11 ENCOUNTER — Encounter: Payer: Self-pay | Admitting: General Surgery

## 2020-07-11 DIAGNOSIS — Z8616 Personal history of COVID-19: Secondary | ICD-10-CM | POA: Insufficient documentation

## 2020-07-11 DIAGNOSIS — F909 Attention-deficit hyperactivity disorder, unspecified type: Secondary | ICD-10-CM | POA: Diagnosis not present

## 2020-07-11 DIAGNOSIS — Z79899 Other long term (current) drug therapy: Secondary | ICD-10-CM | POA: Insufficient documentation

## 2020-07-11 DIAGNOSIS — L0501 Pilonidal cyst with abscess: Secondary | ICD-10-CM | POA: Insufficient documentation

## 2020-07-11 HISTORY — PX: PILONIDAL CYST EXCISION: SHX744

## 2020-07-11 SURGERY — EXCISION, PILONIDAL CYST, EXTENSIVE
Anesthesia: General

## 2020-07-11 MED ORDER — LACTATED RINGERS IV SOLN: INTRAVENOUS | Status: DC

## 2020-07-11 MED ORDER — ONDANSETRON HCL 4 MG/2ML IJ SOLN
INTRAMUSCULAR | Status: AC
  Filled 2020-07-11: qty 2

## 2020-07-11 MED ORDER — LIDOCAINE HCL (PF) 1 % IJ SOLN
INTRAMUSCULAR | Status: AC
  Filled 2020-07-11: qty 30

## 2020-07-11 MED ORDER — BUPIVACAINE-EPINEPHRINE (PF) 0.5% -1:200000 IJ SOLN
INTRAMUSCULAR | Status: AC
  Filled 2020-07-11: qty 30

## 2020-07-11 MED ORDER — ACETAMINOPHEN 10 MG/ML IV SOLN
INTRAVENOUS | Status: DC | PRN
  Administered 2020-07-11: 1000 mg via INTRAVENOUS

## 2020-07-11 MED ORDER — MEPERIDINE HCL 25 MG/ML IJ SOLN: 6.2500 mg | INTRAMUSCULAR | Status: DC | PRN

## 2020-07-11 MED ORDER — PROPOFOL 500 MG/50ML IV EMUL
INTRAVENOUS | Status: DC | PRN
  Administered 2020-07-11: 150 ug/kg/min via INTRAVENOUS

## 2020-07-11 MED ORDER — KETOROLAC TROMETHAMINE 30 MG/ML IJ SOLN
INTRAMUSCULAR | Status: AC
  Filled 2020-07-11: qty 1

## 2020-07-11 MED ORDER — LIDOCAINE-EPINEPHRINE 1 %-1:100000 IJ SOLN
INTRAMUSCULAR | Status: AC
  Filled 2020-07-11: qty 1

## 2020-07-11 MED ORDER — DEXAMETHASONE SODIUM PHOSPHATE 10 MG/ML IJ SOLN
INTRAMUSCULAR | Status: DC | PRN
  Administered 2020-07-11: 10 mg via INTRAVENOUS

## 2020-07-11 MED ORDER — CHLORHEXIDINE GLUCONATE 0.12 % MT SOLN
OROMUCOSAL | Status: AC
  Filled 2020-07-11: qty 15

## 2020-07-11 MED ORDER — SEVOFLURANE IN SOLN
RESPIRATORY_TRACT | Status: AC
  Filled 2020-07-11: qty 250

## 2020-07-11 MED ORDER — MIDAZOLAM HCL 2 MG/2ML IJ SOLN
INTRAMUSCULAR | Status: AC
  Filled 2020-07-11: qty 2

## 2020-07-11 MED ORDER — DEXAMETHASONE SODIUM PHOSPHATE 10 MG/ML IJ SOLN
INTRAMUSCULAR | Status: AC
  Filled 2020-07-11: qty 1

## 2020-07-11 MED ORDER — DEXMEDETOMIDINE (PRECEDEX) IN NS 20 MCG/5ML (4 MCG/ML) IV SYRINGE
PREFILLED_SYRINGE | INTRAVENOUS | Status: DC | PRN
  Administered 2020-07-11: 8 ug via INTRAVENOUS

## 2020-07-11 MED ORDER — FAMOTIDINE 20 MG PO TABS
ORAL_TABLET | ORAL | Status: AC
  Filled 2020-07-11: qty 1

## 2020-07-11 MED ORDER — ACETAMINOPHEN 10 MG/ML IV SOLN
INTRAVENOUS | Status: AC
  Filled 2020-07-11: qty 100

## 2020-07-11 MED ORDER — LIDOCAINE HCL (PF) 2 % IJ SOLN
INTRAMUSCULAR | Status: AC
  Filled 2020-07-11: qty 2

## 2020-07-11 MED ORDER — MIDAZOLAM HCL 2 MG/2ML IJ SOLN
INTRAMUSCULAR | Status: DC | PRN
  Administered 2020-07-11: 2 mg via INTRAVENOUS

## 2020-07-11 MED ORDER — FENTANYL CITRATE (PF) 100 MCG/2ML IJ SOLN
INTRAMUSCULAR | Status: DC | PRN
  Administered 2020-07-11: 50 ug via INTRAVENOUS
  Administered 2020-07-11: 100 ug via INTRAVENOUS

## 2020-07-11 MED ORDER — FENTANYL CITRATE (PF) 250 MCG/5ML IJ SOLN
INTRAMUSCULAR | Status: AC
  Filled 2020-07-11: qty 5

## 2020-07-11 MED ORDER — BUPIVACAINE-EPINEPHRINE 0.5% -1:200000 IJ SOLN
INTRAMUSCULAR | Status: DC | PRN
  Administered 2020-07-11: 30 mL

## 2020-07-11 MED ORDER — HYDROCODONE-ACETAMINOPHEN 5-325 MG PO TABS: 1.0000 | ORAL_TABLET | ORAL | 0 refills | Status: DC | PRN

## 2020-07-11 MED ORDER — SUCCINYLCHOLINE CHLORIDE 20 MG/ML IJ SOLN
INTRAMUSCULAR | Status: DC | PRN
  Administered 2020-07-11: 80 mg via INTRAVENOUS

## 2020-07-11 MED ORDER — PROPOFOL 500 MG/50ML IV EMUL
INTRAVENOUS | Status: AC
  Filled 2020-07-11: qty 50

## 2020-07-11 MED ORDER — LIDOCAINE HCL (CARDIAC) PF 100 MG/5ML IV SOSY
PREFILLED_SYRINGE | INTRAVENOUS | Status: DC | PRN
  Administered 2020-07-11: 40 mg via INTRAVENOUS

## 2020-07-11 MED ORDER — CHLORHEXIDINE GLUCONATE 0.12 % MT SOLN
15.0000 mL | Freq: Once | OROMUCOSAL | Status: AC
  Administered 2020-07-11: 15 mL via OROMUCOSAL

## 2020-07-11 MED ORDER — OXYCODONE HCL 5 MG/5ML PO SOLN
5.0000 mg | Freq: Once | ORAL | Status: DC | PRN
Start: 2020-07-11 — End: 2020-07-12

## 2020-07-11 MED ORDER — KETOROLAC TROMETHAMINE 30 MG/ML IJ SOLN
INTRAMUSCULAR | Status: DC | PRN
  Administered 2020-07-11: 30 mg via INTRAVENOUS

## 2020-07-11 MED ORDER — METRONIDAZOLE 500 MG PO TABS: 500.0000 mg | ORAL_TABLET | Freq: Three times a day (TID) | ORAL | 0 refills | Status: AC

## 2020-07-11 MED ORDER — SODIUM CHLORIDE 0.9 % IV SOLN
1.0000 g | INTRAVENOUS | Status: AC
  Administered 2020-07-11: 1 g via INTRAVENOUS
  Filled 2020-07-11: qty 1

## 2020-07-11 MED ORDER — CHLORHEXIDINE GLUCONATE CLOTH 2 % EX PADS
6.0000 | MEDICATED_PAD | Freq: Once | CUTANEOUS | Status: DC
Start: 2020-07-11 — End: 2020-07-12

## 2020-07-11 MED ORDER — OXYCODONE HCL 5 MG PO TABS: 5.0000 mg | ORAL_TABLET | Freq: Once | ORAL | Status: DC | PRN

## 2020-07-11 MED ORDER — ONDANSETRON HCL 4 MG/2ML IJ SOLN
INTRAMUSCULAR | Status: DC | PRN
  Administered 2020-07-11: 4 mg via INTRAVENOUS

## 2020-07-11 MED ORDER — CHLORHEXIDINE GLUCONATE CLOTH 2 % EX PADS
6.0000 | MEDICATED_PAD | Freq: Once | CUTANEOUS | Status: AC
  Administered 2020-07-11: 6 via TOPICAL

## 2020-07-11 MED ORDER — FENTANYL CITRATE (PF) 100 MCG/2ML IJ SOLN: 25.0000 ug | INTRAMUSCULAR | Status: DC | PRN

## 2020-07-11 MED ORDER — FAMOTIDINE 20 MG PO TABS
20.0000 mg | ORAL_TABLET | Freq: Once | ORAL | Status: AC
  Administered 2020-07-11: 20 mg via ORAL

## 2020-07-11 MED ORDER — PROMETHAZINE HCL 25 MG/ML IJ SOLN: 6.2500 mg | INTRAMUSCULAR | Status: DC | PRN

## 2020-07-11 MED ORDER — ORAL CARE MOUTH RINSE: 15.0000 mL | Freq: Once | OROMUCOSAL | Status: AC

## 2020-07-11 MED ORDER — PROPOFOL 10 MG/ML IV BOLUS
INTRAVENOUS | Status: DC | PRN
  Administered 2020-07-11: 200 mg via INTRAVENOUS

## 2020-07-11 MED ORDER — DEXMEDETOMIDINE (PRECEDEX) IN NS 20 MCG/5ML (4 MCG/ML) IV SYRINGE
PREFILLED_SYRINGE | INTRAVENOUS | Status: AC
  Filled 2020-07-11: qty 5

## 2020-07-11 SURGICAL SUPPLY — 35 items
BLADE SURG 11 STRL SS SAFETY (MISCELLANEOUS) ×2 IMPLANT
BLADE SURG 15 STRL SS SAFETY (BLADE) ×2 IMPLANT
CANISTER SUCT 1200ML W/VALVE (MISCELLANEOUS) ×2 IMPLANT
COVER WAND RF STERILE (DRAPES) ×2 IMPLANT
DRAPE LAPAROTOMY 100X77 ABD (DRAPES) ×2 IMPLANT
DRSG TEGADERM 4X4.75 (GAUZE/BANDAGES/DRESSINGS) ×2 IMPLANT
DRSG TELFA 3X8 NADH (GAUZE/BANDAGES/DRESSINGS) ×2 IMPLANT
ELECT CAUTERY BLADE TIP 2.5 (TIP) ×2
ELECT REM PT RETURN 9FT ADLT (ELECTROSURGICAL) ×2
ELECTRODE CAUTERY BLDE TIP 2.5 (TIP) ×1 IMPLANT
ELECTRODE REM PT RTRN 9FT ADLT (ELECTROSURGICAL) ×1 IMPLANT
GLOVE SURG ENC MOIS LTX SZ7.5 (GLOVE) ×4 IMPLANT
GLOVE SURG UNDER LTX SZ8 (GLOVE) ×4 IMPLANT
GOWN STRL REUS W/ TWL LRG LVL3 (GOWN DISPOSABLE) ×2 IMPLANT
GOWN STRL REUS W/TWL LRG LVL3 (GOWN DISPOSABLE) ×4
KIT TURNOVER KIT A (KITS) ×2 IMPLANT
LABEL OR SOLS (LABEL) ×2 IMPLANT
MANIFOLD NEPTUNE II (INSTRUMENTS) ×2 IMPLANT
NEEDLE HYPO 22GX1.5 SAFETY (NEEDLE) IMPLANT
NEEDLE HYPO 25X1 1.5 SAFETY (NEEDLE) ×2 IMPLANT
NS IRRIG 500ML POUR BTL (IV SOLUTION) ×2 IMPLANT
PACK BASIN MINOR ARMC (MISCELLANEOUS) ×2 IMPLANT
SOL PREP PVP 2OZ (MISCELLANEOUS)
SOLUTION PREP PVP 2OZ (MISCELLANEOUS) IMPLANT
STRIP CLOSURE SKIN 1/2X4 (GAUZE/BANDAGES/DRESSINGS) ×2 IMPLANT
SUT ETHILON 3 0 PS 1 (SUTURE) ×2 IMPLANT
SUT PROLENE 4-0 (SUTURE)
SUT PROLENE 4-0 RB1 30XMFL BLU (SUTURE)
SUT VIC AB 2-0 CT1 (SUTURE) ×2 IMPLANT
SUT VIC AB 2-0 CT2 27 (SUTURE) IMPLANT
SUT VICRYL 3-0 27IN (SUTURE) IMPLANT
SUT VICRYL+ 3-0 144IN (SUTURE) IMPLANT
SUTURE PROLEN 4-0 RB1 30XMFL (SUTURE) IMPLANT
SWABSTK COMLB BENZOIN TINCTURE (MISCELLANEOUS) ×2 IMPLANT
SYR 10ML LL (SYRINGE) ×2 IMPLANT

## 2020-07-11 NOTE — Discharge Instructions (Signed)

## 2020-07-11 NOTE — Anesthesia Postprocedure Evaluation (Signed)
Anesthesia Post Note  Patient: Garrett Hamilton  Procedure(s) Performed: CYST EXCISION PILONIDAL EXTENSIVE (N/A )  Patient location during evaluation: PACU Anesthesia Type: General Level of consciousness: awake and alert Pain management: pain level controlled Vital Signs Assessment: post-procedure vital signs reviewed and stable Respiratory status: spontaneous breathing, nonlabored ventilation, respiratory function stable and patient connected to nasal cannula oxygen Cardiovascular status: blood pressure returned to baseline and stable Postop Assessment: no apparent nausea or vomiting Anesthetic complications: no   No complications documented.   Last Vitals:  Vitals:   07/11/20 1813 07/11/20 1815  BP: 100/87 112/83  Pulse: 74 74  Resp: (!) 23 (!) 24  Temp: (!) 36.3 C   SpO2: 100% 100%    Last Pain:  Vitals:   07/11/20 1357  TempSrc: Oral  PainSc: 0-No pain                 Cleda Mccreedy Deanne Bedgood

## 2020-07-11 NOTE — Transfer of Care (Signed)
Immediate Anesthesia Transfer of Care Note  Patient: Garrett Hamilton  Procedure(s) Performed: CYST EXCISION PILONIDAL EXTENSIVE (N/A )  Patient Location: PACU  Anesthesia Type:General  Level of Consciousness: drowsy and patient cooperative  Airway & Oxygen Therapy: Patient Spontanous Breathing and Patient connected to nasal cannula oxygen  Post-op Assessment: Report given to RN and Post -op Vital signs reviewed and stable  Post vital signs: Reviewed and stable  Last Vitals:  Vitals Value Taken Time  BP 94/63 07/11/20 1740  Temp    Pulse 58 07/11/20 1740  Resp 15 07/11/20 1740  SpO2 100 % 07/11/20 1740  Vitals shown include unvalidated device data.  Last Pain:  Vitals:   07/11/20 1357  TempSrc: Oral  PainSc: 0-No pain         Complications: No complications documented.

## 2020-07-11 NOTE — Anesthesia Preprocedure Evaluation (Addendum)
Anesthesia Evaluation  Patient identified by MRN, date of birth, ID band Patient awake    Reviewed: Allergy & Precautions, NPO status , Patient's Chart, lab work & pertinent test results  History of Anesthesia Complications Negative for: history of anesthetic complications  Airway Mallampati: I  TM Distance: >3 FB Neck ROM: Full    Dental no notable dental hx.    Pulmonary neg pulmonary ROS, neg sleep apnea, neg COPD,    breath sounds clear to auscultation- rhonchi (-) wheezing      Cardiovascular Exercise Tolerance: Good (-) hypertension(-) CAD and (-) Past MI  Rhythm:Regular Rate:Normal - Systolic murmurs and - Diastolic murmurs    Neuro/Psych negative neurological ROS  negative psych ROS   GI/Hepatic negative GI ROS, Neg liver ROS,   Endo/Other  negative endocrine ROSneg diabetes  Renal/GU negative Renal ROS     Musculoskeletal negative musculoskeletal ROS (+)   Abdominal (+) - obese,   Peds  Hematology negative hematology ROS (+)   Anesthesia Other Findings   Reproductive/Obstetrics                             Anesthesia Physical Anesthesia Plan  ASA: I  Anesthesia Plan: General   Post-op Pain Management:    Induction: Intravenous  PONV Risk Score and Plan: 1 and Ondansetron, Dexamethasone and Midazolam  Airway Management Planned: Oral ETT  Additional Equipment:   Intra-op Plan:   Post-operative Plan: Extubation in OR  Informed Consent: I have reviewed the patients History and Physical, chart, labs and discussed the procedure including the risks, benefits and alternatives for the proposed anesthesia with the patient or authorized representative who has indicated his/her understanding and acceptance.     Dental advisory given  Plan Discussed with: CRNA and Anesthesiologist  Anesthesia Plan Comments:         Anesthesia Quick Evaluation  

## 2020-07-11 NOTE — H&P (Signed)
Garrett Hamilton [id number] 1995/04/28     HPI:  25 y/o with symptomatic pilonidal disease. Prior I&D. For excision.  Surgery delayed as the patient had eaten the AM of the procedure.   Medications Prior to Admission  Medication Sig Dispense Refill Last Dose  . fluticasone (FLONASE) 50 MCG/ACT nasal spray Place 1-2 sprays into both nostrils daily as needed for allergies or rhinitis.   Past Week at Unknown time  . ibuprofen (ADVIL) 200 MG tablet Take 200-400 mg by mouth every 8 (eight) hours as needed (for pain.).   Past Week at Unknown time  . VYVANSE 20 MG capsule Take 20 mg by mouth in the morning.   07/10/2020 at Unknown time   Allergies  Allergen Reactions  . Peanuts [Peanut Oil] Swelling  . Shellfish Allergy     unknown   Past Medical History:  Diagnosis Date  . ADHD   . COVID-19    Past Surgical History:  Procedure Laterality Date  . TONSILLECTOMY    . WISDOM TOOTH EXTRACTION     Social History   Socioeconomic History  . Marital status: Single    Spouse name: Not on file  . Number of children: Not on file  . Years of education: Not on file  . Highest education level: Not on file  Occupational History  . Not on file  Tobacco Use  . Smoking status: Never Smoker  . Smokeless tobacco: Current User  Vaping Use  . Vaping Use: Never used  Substance and Sexual Activity  . Alcohol use: Yes    Comment: social  . Drug use: No  . Sexual activity: Not on file  Other Topics Concern  . Not on file  Social History Narrative   Lives alone   Social Determinants of Health   Financial Resource Strain: Not on file  Food Insecurity: Not on file  Transportation Needs: Not on file  Physical Activity: Not on file  Stress: Not on file  Social Connections: Not on file  Intimate Partner Violence: Not on file   Social History   Social History Narrative   Lives alone     ROS: Negative.     PE: HEENT: Negative. Lungs: Clear. Cardio:  RR.   Assessment/Plan:  Proceed with planned pilonidal cyst excision.    Merrily [facility] 07/11/2020

## 2020-07-11 NOTE — Op Note (Signed)
Preoperative diagnosis: Pilonidal cyst with chronic abscess.  Postoperative diagnosis: Same.  Operative procedure: Excision of chronic abscess right gluteal area and pilonidal sinus.  Operating surgeon: Donnalee Curry, MD.  Anesthesia: General endotracheal, Marcaine 0.5% with 1: 200,000 units of epinephrine, 30 cc.  Estimated blood loss: Less than 5 cc.  Clinical note: This 25 year old male has had a history of inflammation in the gluteal area for the last 2-3 years.  He is undergone incision and drainage on 1 occasion.  He has had minimal relief from the longstanding discomfort.  He is admitted now for planned surgical excision.  The patient received Invanz prior to surgery.  Operative note: Patient underwent general endotracheal anesthesia and tolerated this well.  He was rolled to the prone position and appropriately padded.  The buttocks were taped apart and the area cleansed with ChloraPrep and draped.  Marcaine was infiltrated for postoperative analgesia.  This was about 3 cm away from the planned incision.  There was a nodule area in the midline along the natal cleft and then the palpable thickening about 3 cm off the midline just superior and to the right of this area.  A elliptical incision to encompass the gluteal thickening as well as the thickening along the midportion of the natal cleft was made.  The skin was incised sharply and the remaining dissection completed with electrocautery.  In the midline there was minimal inflammation below about a centimeter from the epidermis.  On the right gluteal area inflammatory process again went right up to the dermis and avoided the deep tissue.  All of the chronically inflamed tissue was sharply excised.  A knife was used on the lateral portion of the right gluteal skin to minimize thermal injury to the dermis.  After the area was curetted closure was undertaken multiple layers with 2-0 Vicryl figure-of-eight sutures in the deep layer and a  "crotch stitch" to bring together the radial extension from the midline.  The skin was closed with interrupted 4-0 horizontal mattress sutures.  Telfa and Tegaderm dressing was applied.  The patient was taken to the PACU in stable condition.

## 2020-07-11 NOTE — Anesthesia Procedure Notes (Signed)
Procedure Name: Intubation Date/Time: 07/11/2020 4:21 PM Performed by: Jonna Clark, CRNA Pre-anesthesia Checklist: Patient identified, Patient being monitored, Timeout performed, Emergency Drugs available and Suction available Patient Re-evaluated:Patient Re-evaluated prior to induction Oxygen Delivery Method: Circle system utilized Preoxygenation: Pre-oxygenation with 100% oxygen Induction Type: IV induction Ventilation: Mask ventilation without difficulty Laryngoscope Size: Mac and 4 Grade View: Grade II Tube type: Oral Tube size: 7.0 mm Number of attempts: 1 Airway Equipment and Method: Stylet Placement Confirmation: ETT inserted through vocal cords under direct vision,  positive ETCO2 and breath sounds checked- equal and bilateral Secured at: 21 cm Tube secured with: Tape Dental Injury: Teeth and Oropharynx as per pre-operative assessment

## 2020-07-12 ENCOUNTER — Encounter: Payer: Self-pay | Admitting: General Surgery

## 2020-07-13 LAB — SURGICAL PATHOLOGY

## 2020-11-22 ENCOUNTER — Other Ambulatory Visit: Payer: Self-pay

## 2020-11-22 NOTE — Progress Notes (Signed)
Pt completed random Etoh and UDS.  [facility] specimen ID: [id number]

## 2021-01-02 ENCOUNTER — Ambulatory Visit
Admission: RE | Admit: 2021-01-02 | Discharge: 2021-01-02 | Disposition: A | Discharge status: Home / Self Care | Payer: Federal, State, Local not specified - PPO | Source: Ambulatory Visit | Attending: Emergency Medicine | Admitting: Emergency Medicine

## 2021-01-02 ENCOUNTER — Other Ambulatory Visit: Payer: Self-pay

## 2021-01-02 VITALS — BP 111/71 | HR 80 | Temp 98.3°F | Resp 18

## 2021-01-02 DIAGNOSIS — J069 Acute upper respiratory infection, unspecified: Secondary | ICD-10-CM

## 2021-01-02 MED ORDER — IPRATROPIUM BROMIDE 0.06 % NA SOLN: 2.0000 | Freq: Four times a day (QID) | NASAL | 12 refills | Status: DC

## 2021-01-02 NOTE — Discharge Instructions (Addendum)
Use the Atrovent nasal spray, 2 squirts in each nostril every 6 hours as needed for nasal congestion.  Perform sinus irrigation 2-3 times a day with a NeilMed sinus rinse kit and distilled water.  Do not use tap water.  You can use plain over-the-counter Mucinex every 6 hours to break up the stickiness of the mucus so your body can clear it.  Increase your oral fluid intake to thin out your mucus so that is also able for your body to clear more easily.  Use over-the-counter cough preparations as needed.  If you develop any new or worsening symptoms return for reevaluation or see your primary care provider.

## 2021-01-02 NOTE — ED Triage Notes (Signed)
Pt presents with chills, congestions,and sinus pressure/pain x 4 days.  Pt also believes he has a suture left over from a pilondial cyst surgery he had in June. Pt was not advised to return to have sutures removed so they stayed in appx. 2 months. Pt states that 2 days ago he felt what could be a suture.

## 2021-01-02 NOTE — ED Provider Notes (Signed)
[facility]    CSN: [id number] Arrival date & time: 01/02/21  0955      History   Chief Complaint Chief Complaint  Patient presents with   Suture / Staple Removal   Sinus Pressure    Appointment    HPI Garrett Hamilton is a 25 y.o. male.   HPI  25 year old male here for multiple complaints.  Patient's primary complaint is chills, nasal congestion, and sinus pressure that is been present for the past 4 days.  This is associated with scant intermittent clear nasal discharge, a scratchy sore throat that was present on the first day and has resolved, and a cough that is intermittently productive in the mornings only.  He is also complaining of body aches in the evening and headache.  He denies fever, ear pain, shortness breath or wheezing, or GI complaints.  Patient second complaint is that he has a suture from a pilonidal cyst surgery from June of this year.  Patient reports that his sutures remained in for 2 months before having them removed and that Dr. Bary Castilla thought there might be 1 remaining.  Patient reports that the suture erupted but is unsure of when and he is here for removal.  Past Medical History:  Diagnosis Date   ADHD    COVID-19     Patient Active Problem List   Diagnosis Date Noted   Attention deficit hyperactivity disorder (ADHD), predominantly inattentive type 06/09/2018   Anxiety disorder 08/22/2016   Allergic rhinitis 05/27/2011   Concern about growth 05/27/2011   Peanut allergy 05/27/2011    Past Surgical History:  Procedure Laterality Date   PILONIDAL CYST EXCISION N/A 07/11/2020   Procedure: CYST EXCISION PILONIDAL EXTENSIVE;  Surgeon: Robert Bellow, MD;  Location: [facility] ORS;  Service: General;  Laterality: N/A;   TONSILLECTOMY     WISDOM TOOTH EXTRACTION         Home Medications    Prior to Admission medications   Medication Sig Start Date End Date Taking? Authorizing Provider  ipratropium (ATROVENT) 0.06 % nasal spray  Place 2 sprays into both nostrils 4 (four) times daily. 01/02/21  Yes Margarette Canada, NP  VYVANSE 20 MG capsule Take 20 mg by mouth in the morning. 09/30/18  Yes [provider]  fluticasone (FLONASE) 50 MCG/ACT nasal spray Place 1-2 sprays into both nostrils daily as needed for allergies or rhinitis.    [provider]  HYDROcodone-acetaminophen (NORCO/VICODIN) 5-325 MG tablet Take 1 tablet by mouth every 4 (four) hours as needed for moderate pain. 07/11/20 07/11/21  Robert Bellow, MD  ibuprofen (ADVIL) 200 MG tablet Take 200-400 mg by mouth every 8 (eight) hours as needed (for pain.).    [provider]  methylphenidate 36 MG PO CR tablet Take 36 mg by mouth daily.  10/02/18  [provider]    Family History Family History  Problem Relation Age of Onset   Healthy Mother    Healthy Father     Social History Social History   Tobacco Use   Smoking status: Never   Smokeless tobacco: Current  Vaping Use   Vaping Use: Never used  Substance Use Topics   Alcohol use: Yes    Comment: social   Drug use: No     Allergies   Peanuts [peanut oil] and Shellfish allergy   Review of Systems Review of Systems  Constitutional:  Positive for chills. Negative for activity change, appetite change and fever.  HENT:  Positive for  congestion, rhinorrhea, sinus pressure and sore throat. Negative for ear pain.   Respiratory:  Positive for cough. Negative for shortness of breath and wheezing.   Gastrointestinal:  Negative for diarrhea, nausea and vomiting.  Musculoskeletal:  Positive for arthralgias and myalgias.  Skin:  Negative for rash.  Neurological:  Positive for headaches.  Hematological: Negative.   Psychiatric/Behavioral: Negative.      Physical Exam Triage Vital Signs ED Triage Vitals  Enc Vitals Group     BP 01/02/21 1017 111/71     Pulse Rate 01/02/21 1017 80     Resp 01/02/21 1017 18     Temp 01/02/21 1017 98.3 F (36.8 C)     Temp Source  01/02/21 1017 Oral     SpO2 01/02/21 1017 99 %     Weight --      Height --      Head Circumference --      Peak Flow --      Pain Score 01/02/21 1016 0     Pain Loc --      Pain Edu? --      Excl. in [place]? --    No data found.  Updated Vital Signs BP 111/71 (BP Location: Left Arm)   Pulse 80   Temp 98.3 F (36.8 C) (Oral)   Resp 18   SpO2 99%   Visual Acuity Right Eye Distance:   Left Eye Distance:   Bilateral Distance:    Right Eye Near:   Left Eye Near:    Bilateral Near:     Physical Exam Vitals and nursing note reviewed.  Constitutional:      General: He is not in acute distress.    Appearance: Normal appearance. He is normal weight. He is not ill-appearing.  HENT:     Head: Normocephalic and atraumatic.     Right Ear: Tympanic membrane, ear canal and external ear normal. There is no impacted cerumen.     Left Ear: Tympanic membrane, ear canal and external ear normal. There is no impacted cerumen.     Nose: Congestion and rhinorrhea present.     Comments: Mild erythema and edema of both nasal passages with scant clear nasal discharge.    Mouth/Throat:     Mouth: Mucous membranes are moist.     Pharynx: Oropharynx is clear. Posterior oropharyngeal erythema present.  Cardiovascular:     Rate and Rhythm: Normal rate and regular rhythm.     Pulses: Normal pulses.     Heart sounds: Normal heart sounds. No murmur heard.   No gallop.  Pulmonary:     Effort: Pulmonary effort is normal.     Breath sounds: Normal breath sounds. No wheezing, rhonchi or rales.  Musculoskeletal:     Cervical back: Normal range of motion and neck supple.  Lymphadenopathy:     Cervical: No cervical adenopathy.  Skin:    General: Skin is warm and dry.     Capillary Refill: Capillary refill takes less than 2 seconds.     Findings: No erythema or rash.  Neurological:     General: No focal deficit present.     Mental Status: He is alert and oriented to person, place, and time.   Psychiatric:        Mood and Affect: Mood normal.        Behavior: Behavior normal.        Thought Content: Thought content normal.        Judgment: Judgment normal.  UC Treatments / Results  Labs (all labs ordered are listed, but only abnormal results are displayed) Labs Reviewed - No data to display  EKG   Radiology No results found.  Procedures Procedures (including critical care time)  Medications Ordered in UC Medications - No data to display  Initial Impression / Assessment and Plan / UC Course  I have reviewed the triage vital signs and the nursing notes.  Pertinent labs & imaging results that were available during my care of the patient were reviewed by me and considered in my medical decision making (see chart for details).  Paring 25 year old male here for evaluation of respiratory complaints and a possible single retained suture from pilonidal cyst surgery 6 months ago as outlined HPI above.  Patient's physical exam reveals protegrin tympanic membranes bilaterally with normal light reflex and clear external auditory canals.  Nasal mucosa is very mildly erythematous and edematous with scant clear nasal discharge in both nares.  Oropharyngeal exam reveals very mild posterior oropharyngeal erythema and clear postnasal drip.  No cervical lymphadenopathy appreciated on exam.  Cardiopulmonary exam reveals clear lung sounds in all fields.  I advised the patient that his sutures were put in by a surgeon in June and he needs to return to see Dr. Bary Castilla to have that suture removed.  The range of his exam is consistent with a viral upper respiratory infection and will treat accordingly.  We will give patient Atrovent nasal spray to help with the nasal congestion and I encouraged him to perform sinus irrigation to help reveal the congestion in his sinus cavities.  Given the fact that he is not having any significant cough he can use over-the-counter cough preparations as  needed.   Final Clinical Impressions(s) / UC Diagnoses   Final diagnoses:  Acute upper respiratory infection     Discharge Instructions      Use the Atrovent nasal spray, 2 squirts in each nostril every 6 hours as needed for nasal congestion.  Perform sinus irrigation 2-3 times a day with a NeilMed sinus rinse kit and distilled water.  Do not use tap water.  You can use plain over-the-counter Mucinex every 6 hours to break up the stickiness of the mucus so your body can clear it.  Increase your oral fluid intake to thin out your mucus so that is also able for your body to clear more easily.  Use over-the-counter cough preparations as needed.  If you develop any new or worsening symptoms return for reevaluation or see your primary care provider.      ED Prescriptions     Medication Sig Dispense Auth. Provider   ipratropium (ATROVENT) 0.06 % nasal spray Place 2 sprays into both nostrils 4 (four) times daily. 15 mL Margarette Canada, NP      PDMP not reviewed this encounter.   Margarette Canada, NP 01/02/21 [phone]

## 2021-03-11 ENCOUNTER — Ambulatory Visit: Payer: Self-pay | Admitting: Physician Assistant

## 2021-03-11 ENCOUNTER — Telehealth: Payer: Self-pay

## 2021-03-11 ENCOUNTER — Other Ambulatory Visit: Payer: Self-pay

## 2021-03-11 ENCOUNTER — Encounter: Payer: Self-pay | Admitting: Physician Assistant

## 2021-03-11 VITALS — BP 120/71 | HR 89 | Temp 98.6°F | Resp 12 | Ht 66.0 in | Wt 135.0 lb

## 2021-03-11 DIAGNOSIS — F900 Attention-deficit hyperactivity disorder, predominantly inattentive type: Secondary | ICD-10-CM

## 2021-03-11 NOTE — Telephone Encounter (Signed)
[facility] contacted Aloysious & they are unable to get him in before 06/2021.  He called back to the clinic & requested an appt with Dr. Jan Fireman at [facility] - phone # [phone].  Appt scheduled with Dr. Christella Hartigan for 03/18/2021 at 8:00 am.  Left voice mail for Jovany to call me to go over appt info.  AMD

## 2021-03-11 NOTE — Progress Notes (Signed)
° °  Subjective:ADD    Patient ID: Garrett Hamilton, male    DOB: Nov 15, 1995, 26 y.o.   MRN: [id number]  HPI Patient is 26 year old male who wished to be reevaluated for change in medication for ADHD.  Patient with diagnosis at age 72 of ADD and is taking Vyvanse.  Patient stated due to cost he wished to be reevaluated be switched to all Adderall.  Patient past medical treatment for this condition was with [facility] under Dr.Sionne Greggory Stallion.   Review of Systems Anxiety and ADHD    Objective:   Physical Exam No acute distress. Temperature 98.6, pulse 89, respiration 12, BP is 120/71, and patient 97% O2 sat on room air.  Patient with 135 pounds.       Assessment & Plan: ADHD.  Advised patient that he will need reevaluation and will consult him to [facility]

## 2021-03-11 NOTE — Progress Notes (Signed)
Online referral sent to [facility] from their website.  [facility] [address] [place], [place]  [address] Phone: [phone] Email:  [email or web address]  AMD

## 2021-03-11 NOTE — Progress Notes (Signed)
Pt wanting to switch over from vyvance to adderall due to pricing. Gretel Acre

## 2021-06-25 ENCOUNTER — Other Ambulatory Visit: Payer: Self-pay

## 2021-06-25 ENCOUNTER — Ambulatory Visit: Payer: Self-pay | Admitting: Physician Assistant

## 2021-06-25 ENCOUNTER — Encounter: Payer: Self-pay | Admitting: Physician Assistant

## 2021-06-25 DIAGNOSIS — J029 Acute pharyngitis, unspecified: Secondary | ICD-10-CM

## 2021-06-25 DIAGNOSIS — J020 Streptococcal pharyngitis: Secondary | ICD-10-CM

## 2021-06-25 DIAGNOSIS — Z1152 Encounter for screening for COVID-19: Secondary | ICD-10-CM

## 2021-06-25 LAB — POCT RAPID STREP A (OFFICE): Rapid Strep A Screen: NEGATIVE

## 2021-06-25 LAB — POC COVID19 BINAXNOW: SARS Coronavirus 2 Ag: NEGATIVE

## 2021-06-25 MED ORDER — PSEUDOEPH-BROMPHEN-DM 30-2-10 MG/5ML PO SYRP: 5.0000 mL | ORAL_SOLUTION | Freq: Four times a day (QID) | ORAL | 0 refills | Status: DC | PRN

## 2021-06-25 MED ORDER — LIDOCAINE VISCOUS HCL 2 % MT SOLN
5.0000 mL | Freq: Four times a day (QID) | OROMUCOSAL | 0 refills | Status: DC | PRN
Start: 2021-06-25 — End: 2021-07-09

## 2021-06-25 NOTE — Progress Notes (Signed)
? ?  Subjective: Sore throat  ? ? Patient ID: Garrett Hamilton, male    DOB: 1996-01-13, 26 y.o.   MRN: [id number] ? ?HPI ?Patient complain of sore throat for 5 days.  Patient tested negative for strep pharyngitis and COVID-19 prior to this interview. ? ? ?Review of Systems ?Anxiety, ADHD, allergic rhinitis. ?   ?Objective:  ? Physical Exam ?This is a virtual telephonic visit. ? ? ? ?   ?Assessment & Plan: Viral pharyngitis  ? ?Patient given discharge care instruction advised take viscous lidocaine mixed with Bromfed-DM as directed.  Follow-up with no improvement in 48 hours. ?

## 2021-06-25 NOTE — Progress Notes (Signed)
Pt has been experiencing sore throat and congestion for about 5 ddays with no fever. Pt states the past couples your throat woke up with pain. ?

## 2021-06-26 LAB — NOVEL CORONAVIRUS, NAA: SARS-CoV-2, NAA: NOT DETECTED

## 2021-06-28 ENCOUNTER — Ambulatory Visit
Admission: RE | Admit: 2021-06-28 | Discharge: 2021-06-28 | Disposition: A | Discharge status: Home / Self Care | Payer: 59 | Source: Ambulatory Visit | Attending: Emergency Medicine | Admitting: Emergency Medicine

## 2021-06-28 VITALS — BP 120/90 | HR 102 | Temp 98.4°F | Resp 18 | Ht 66.0 in | Wt 135.0 lb

## 2021-06-28 DIAGNOSIS — J029 Acute pharyngitis, unspecified: Secondary | ICD-10-CM | POA: Diagnosis not present

## 2021-06-28 LAB — MONONUCLEOSIS SCREEN: Mono Screen: NEGATIVE

## 2021-06-28 LAB — GROUP A STREP BY PCR: Group A Strep by PCR: NOT DETECTED

## 2021-06-28 MED ORDER — FAMOTIDINE 20 MG PO TABS: 20.0000 mg | ORAL_TABLET | Freq: Two times a day (BID) | ORAL | 0 refills | Status: DC

## 2021-06-28 MED ORDER — IPRATROPIUM BROMIDE 0.06 % NA SOLN: 2.0000 | Freq: Four times a day (QID) | NASAL | 0 refills | Status: DC

## 2021-06-28 NOTE — ED Triage Notes (Signed)
Patient is here for "sorethroat and hoarse voice" for about "10-11 days now". Now "unbearingly painful" sorethroat. X2 days ago "strep and covid19 negative in pcp office". No fever known. No cough. No sob. No new/unexplained rash.

## 2021-06-28 NOTE — ED Provider Notes (Signed)
HPI  SUBJECTIVE:  Garrett Hamilton is a 26 y.o. male who presents with 10 to 11 days of sore throat, headache, postnasal drip.  No fevers, body aches, nasal congestion, rhinorrhea, cough, wheeze, shortness of breath, nausea, vomiting, diarrhea, abdominal pain, rash.  No allergy or GERD symptoms.  No drooling, trismus, neck stiffness, voice changes, sensation of throat swelling shut, difficulty breathing.  No known mono exposure.  No antibiotics in the past month.  He took an antipyretic within 6 hours of evaluation.  He has been using Flonase, allergy medication, over-the-counter cold and sinus medications,  viscous lidocaine, Bromfed without improvement in his symptoms.  Symptoms are worse with lying down at night.  He states that the pain wakes him up.  States that he has had a negative COVID and strep test during this time.  He has a past medical history of GERD not on any medications for this, allergies and is status post tonsillectomy.  PCP: [facility].  Patient had an E-visit with his PCP on 5/16, thought to have a viral pharyngitis, was prescribed viscous lidocaine to mix with Bromfed-DM and was advised to follow-up if no improvement in 48 hours.   Past Medical History:  Diagnosis Date   ADHD    COVID-19     Past Surgical History:  Procedure Laterality Date   PILONIDAL CYST EXCISION N/A 07/11/2020   Procedure: CYST EXCISION PILONIDAL EXTENSIVE;  Surgeon: Earline Mayotte, MD;  Location: [facility] ORS;  Service: General;  Laterality: N/A;   TONSILLECTOMY     WISDOM TOOTH EXTRACTION      Family History  Problem Relation Age of Onset   Healthy Mother    Healthy Father     Social History   Tobacco Use   Smoking status: Never   Smokeless tobacco: Current  Vaping Use   Vaping Use: Never used  Substance Use Topics   Alcohol use: Yes    Comment: social   Drug use: No    No current facility-administered medications for this encounter.  Current Outpatient Medications:     amphetamine-dextroamphetamine (ADDERALL XR) 15 MG 24 hr capsule, Take by mouth every morning., Disp: , Rfl:    famotidine (PEPCID) 20 MG tablet, Take 1 tablet (20 mg total) by mouth 2 (two) times daily., Disp: 40 tablet, Rfl: 0   ipratropium (ATROVENT) 0.06 % nasal spray, Place 2 sprays into both nostrils 4 (four) times daily., Disp: 15 mL, Rfl: 0   Pseudoephedrine-Ibuprofen (ADVIL COLD/SINUS PO), Take by mouth., Disp: , Rfl:    brompheniramine-pseudoephedrine-DM 30-2-10 MG/5ML syrup, Take 5 mLs by mouth 4 (four) times daily as needed. Mix with 5 mL of viscous lidocaine for swish and swallow, Disp: 120 mL, Rfl: 0   ibuprofen (ADVIL) 200 MG tablet, Take 200-400 mg by mouth every 8 (eight) hours as needed (for pain.)., Disp: , Rfl:    lidocaine (XYLOCAINE) 2 % solution, Use as directed 5 mLs in the mouth or throat every 6 (six) hours as needed for mouth pain. Mix with 5 mL of Bromfed-DM for swish and slow swallow, Disp: 120 mL, Rfl: 0  Allergies  Allergen Reactions   Peanuts [Peanut Oil] Swelling   Shellfish Allergy Hives     ROS  As noted in HPI.   Physical Exam  BP 120/90 (BP Location: Left Arm)   Pulse (!) 102   Temp 98.4 F (36.9 C) (Oral)   Resp 18   Ht 5\' 6"  (1.676 m)   Wt 61.2 kg  SpO2 100%   BMI 21.79 kg/m   Constitutional: Well developed, well nourished, no acute distress Eyes:  EOMI, conjunctiva normal bilaterally HENT: Normocephalic, atraumatic,mucus membranes moist.  No nasal congestion.  Normal turbinates.  No maxillary, frontal sinus tenderness.  Tonsils surgically absent.  Uvula midline.  No drooling, trismus.  Voice normal.  Intensely erythematous posterior oropharynx with postnasal drip and cobblestoning. Neck: Positive posterior cervical lymphadenopathy Respiratory: Normal inspiratory effort Cardiovascular: Normal rate, no murmurs GI: nondistended, soft, nontender, no splenomegaly skin: No rash, skin intact Musculoskeletal: no deformities Neurologic: Alert &  oriented x 3, no focal neuro deficits Psychiatric: Speech and behavior appropriate   ED Course   Medications - No data to display  Orders Placed This Encounter  Procedures   Group A Strep by PCR    Standing Status:   Standing    Number of Occurrences:   1    Order Specific Question:   Patient immune status    Answer:   Normal    Order Specific Question:   Release to patient    Answer:   Immediate   Mononucleosis screen    Standing Status:   Standing    Number of Occurrences:   1    Results for orders placed or performed during the hospital encounter of 06/28/21 (from the past 24 hour(s))  Group A Strep by PCR     Status: None   Collection Time: 06/28/21  4:13 PM   Specimen: Throat; Sterile Swab  Result Value Ref Range   Group A Strep by PCR NOT DETECTED NOT DETECTED  Mononucleosis screen     Status: None   Collection Time: 06/28/21  5:24 PM  Result Value Ref Range   Mono Screen NEGATIVE NEGATIVE   No results found.  ED Clinical Impression  1. Sore throat      ED Assessment/Plan  Outside records reviewed.  As noted in HPI.  Strep PCR negative.  Will check a mono given duration of symptoms.  Suspect postnasal drip plus or minus acid reflux causing his symptoms.  Home with ipratropium nasal spray, Pepcid, Benadryl/Maalox mixture, elevate head of the bed.  Will call patient at [phone] if mono is positive and we will discuss next steps.  Follow-up with PCP as needed.  Mono negative.  Plan as above.  Discussed labs,  MDM, treatment plan, and plan for follow-up with patient.  patient agrees with plan.   Meds ordered this encounter  Medications   ipratropium (ATROVENT) 0.06 % nasal spray    Sig: Place 2 sprays into both nostrils 4 (four) times daily.    Dispense:  15 mL    Refill:  0   famotidine (PEPCID) 20 MG tablet    Sig: Take 1 tablet (20 mg total) by mouth 2 (two) times daily.    Dispense:  40 tablet    Refill:  0      *This clinic note was created  using Scientist, clinical (histocompatibility and immunogenetics). Therefore, there may be occasional mistakes despite careful proofreading.  ?    Domenick Gong, MD 06/28/21 1756

## 2021-06-28 NOTE — Discharge Instructions (Addendum)
Your strep PCR was negative today.  I have sent off for mono.  Please answer phone calls from unknown numbers today.  If you do not hear from you by the end of the day, you can assume that your mono is negative.  You can also call here and get your result.  I suspect that postnasal drip and acid reflux are causing your symptoms.  Try the ipratropium nasal spray and the Pepcid.  Elevate the head of your bed to 30 degrees to help prevent reflux when you lie down.  Some people find salt water gargles and  Traditional Medicinal's "Throat Coat" tea helpful. Take 5 mL of liquid Benadryl and 5 mL of Maalox. Mix it together, and then hold it in your mouth for as long as you can and then swallow. You may do this 4 times a day.    Go to www.goodrx.com  or www.costplusdrugs.com to look up your medications. This will give you a list of where you can find your prescriptions at the most affordable prices. Or ask the pharmacist what the cash price is, or if they have any other discount programs available to help make your medication more affordable. This can be less expensive than what you would pay with insurance.

## 2021-07-05 ENCOUNTER — Ambulatory Visit: Payer: Self-pay

## 2021-07-05 DIAGNOSIS — Z0289 Encounter for other administrative examinations: Secondary | ICD-10-CM

## 2021-07-05 DIAGNOSIS — Z0000 Encounter for general adult medical examination without abnormal findings: Secondary | ICD-10-CM

## 2021-07-05 LAB — POCT URINALYSIS DIPSTICK
Bilirubin, UA: NEGATIVE
Blood, UA: NEGATIVE
Glucose, UA: NEGATIVE
Ketones, UA: NEGATIVE
Leukocytes, UA: NEGATIVE
Nitrite, UA: NEGATIVE
Protein, UA: NEGATIVE
Spec Grav, UA: 1.02 (ref 1.010–1.025)
Urobilinogen, UA: 0.2 E.U./dL
pH, UA: 6.5 (ref 5.0–8.0)

## 2021-07-05 NOTE — Progress Notes (Signed)
Pt presents today for physical labs, will return to clinic for scheduled physical.  

## 2021-07-06 LAB — CMP12+LP+TP+TSH+6AC+CBC/D/PLT
ALT: 27 IU/L (ref 0–44)
AST: 27 IU/L (ref 0–40)
Albumin/Globulin Ratio: 1.9 (ref 1.2–2.2)
Albumin: 5 g/dL (ref 4.1–5.2)
Alkaline Phosphatase: 100 IU/L (ref 44–121)
BUN/Creatinine Ratio: 17 (ref 9–20)
BUN: 14 mg/dL (ref 6–20)
Basophils Absolute: 0 10*3/uL (ref 0.0–0.2)
Basos: 1 %
Bilirubin Total: 0.7 mg/dL (ref 0.0–1.2)
Calcium: 9.9 mg/dL (ref 8.7–10.2)
Chloride: 99 mmol/L (ref 96–106)
Chol/HDL Ratio: 2.7 ratio (ref 0.0–5.0)
Cholesterol, Total: 161 mg/dL (ref 100–199)
Creatinine, Ser: 0.82 mg/dL (ref 0.76–1.27)
EOS (ABSOLUTE): 0.2 10*3/uL (ref 0.0–0.4)
Eos: 3 %
Estimated CHD Risk: <0.5 times avg. (ref 0.0–1.0)
Free Thyroxine Index: 2.1 (ref 1.2–4.9)
GGT: 21 IU/L (ref 0–65)
Globulin, Total: 2.7 g/dL (ref 1.5–4.5)
Glucose: 114 mg/dL — ABNORMAL HIGH (ref 70–99)
HDL: 59 mg/dL (ref >39)
Hematocrit: 42.9 % (ref 37.5–51.0)
Hemoglobin: 14.4 g/dL (ref 13.0–17.7)
Immature Grans (Abs): 0 10*3/uL (ref 0.0–0.1)
Immature Granulocytes: 0 %
Iron: 118 ug/dL (ref 38–169)
LDH: 192 IU/L (ref 121–224)
LDL Chol Calc (NIH): 83 mg/dL (ref 0–99)
Lymphocytes Absolute: 1.7 10*3/uL (ref 0.7–3.1)
Lymphs: 25 %
MCH: 28.6 pg (ref 26.6–33.0)
MCHC: 33.6 g/dL (ref 31.5–35.7)
MCV: 85 fL (ref 79–97)
Monocytes Absolute: 0.4 10*3/uL (ref 0.1–0.9)
Monocytes: 6 %
Neutrophils Absolute: 4.5 10*3/uL (ref 1.4–7.0)
Neutrophils: 65 %
Phosphorus: 3.9 mg/dL (ref 2.8–4.1)
Platelets: 420 10*3/uL (ref 150–450)
Potassium: 4.8 mmol/L (ref 3.5–5.2)
RBC: 5.04 x10E6/uL (ref 4.14–5.80)
RDW: 12.1 % (ref 11.6–15.4)
Sodium: 140 mmol/L (ref 134–144)
T3 Uptake Ratio: 25 % (ref 24–39)
T4, Total: 8.2 ug/dL (ref 4.5–12.0)
TSH: 0.742 u[IU]/mL (ref 0.450–4.500)
Total Protein: 7.7 g/dL (ref 6.0–8.5)
Triglycerides: 104 mg/dL (ref 0–149)
Uric Acid: 4.6 mg/dL (ref 3.8–8.4)
VLDL Cholesterol Cal: 19 mg/dL (ref 5–40)
WBC: 6.8 10*3/uL (ref 3.4–10.8)
eGFR: 124 mL/min/{1.73_m2} (ref >59)

## 2021-07-09 ENCOUNTER — Ambulatory Visit: Payer: Self-pay | Admitting: Physician Assistant

## 2021-07-09 ENCOUNTER — Ambulatory Visit: Payer: Self-pay

## 2021-07-09 ENCOUNTER — Encounter: Payer: Self-pay | Admitting: Physician Assistant

## 2021-07-09 VITALS — BP 120/78 | HR 76 | Temp 98.4°F | Resp 12 | Ht 66.0 in | Wt 132.0 lb

## 2021-07-09 DIAGNOSIS — Z0289 Encounter for other administrative examinations: Secondary | ICD-10-CM

## 2021-07-09 DIAGNOSIS — Z0000 Encounter for general adult medical examination without abnormal findings: Secondary | ICD-10-CM

## 2021-07-09 DIAGNOSIS — H903 Sensorineural hearing loss, bilateral: Secondary | ICD-10-CM

## 2021-07-09 NOTE — Progress Notes (Signed)
[facility]  ____________________________________________   None    (approximate)  I have reviewed the triage vital signs and the nursing notes.   HISTORY  Chief Complaint Employment Physical    HPI Garrett Hamilton is a 26 y.o. 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  Patient has a history of significant hearing loss.  Patient also has ADHD.  Patient takes Adderall for condition.      Past Medical History:  Diagnosis Date   ADHD    COVID-19     Patient Active Problem List   Diagnosis Date Noted   Attention deficit hyperactivity disorder (ADHD), predominantly inattentive type 06/09/2018   Anxiety disorder 08/22/2016   Allergic rhinitis 05/27/2011   Concern about growth 05/27/2011   Peanut allergy 05/27/2011    Past Surgical History:  Procedure Laterality Date   PILONIDAL CYST EXCISION N/A 07/11/2020   Procedure: CYST EXCISION PILONIDAL EXTENSIVE;  Surgeon: Robert Bellow, MD;  Location: [facility] ORS;  Service: General;  Laterality: N/A;   TONSILLECTOMY     WISDOM TOOTH EXTRACTION      Prior to Admission medications   Medication Sig Start Date End Date Taking? Authorizing Provider  amphetamine-dextroamphetamine (ADDERALL XR) 15 MG 24 hr capsule Take by mouth every morning. 05/30/21  Yes [provider]  cetirizine (ZYRTEC) 10 MG tablet Take 10 mg by mouth daily.   Yes [provider]  fluticasone (FLONASE) 50 MCG/ACT nasal spray Place into both nostrils daily.   Yes [provider]  brompheniramine-pseudoephedrine-DM 30-2-10 MG/5ML syrup Take 5 mLs by mouth 4 (four) times daily as needed. Mix with 5 mL of viscous lidocaine for swish and swallow 06/25/21   Sable Feil, PA-C  methylphenidate 36 MG PO CR tablet Take 36 mg by mouth daily.  10/02/18  [provider]    Allergies Peanuts [peanut oil] and Shellfish allergy  Family History  Problem Relation Age of  Onset   Healthy Mother    Healthy Father     Social History Social History   Tobacco Use   Smoking status: Never   Smokeless tobacco: Current  Vaping Use   Vaping Use: Never used  Substance Use Topics   Alcohol use: Yes    Comment: social   Drug use: No    Review of Systems Constitutional: No fever/chills Eyes: No visual changes. ENT: No sore throat.  High-frequency hearing loss Cardiovascular: Denies chest pain. Respiratory: Denies shortness of breath. Gastrointestinal: No abdominal pain.  No nausea, no vomiting.  No diarrhea.  No constipation. Genitourinary: Negative for dysuria. Musculoskeletal: Negative for back pain. Skin: Negative for rash. Neurological: Negative for headaches, focal weakness or numbness. Psychiatric: ADHD Allergic/Immunilogical: Peanut oral and shellfish ____________________________________________   PHYSICAL EXAM:  VITAL SIGNS: BP 120/78, pulse 76, respiration 12, temperature 98.4, and patient 99% O2 sat on room air.  Patient weighs 132 pounds. Constitutional: Alert and oriented. Well appearing and in no acute distress. Eyes: Conjunctivae are normal. PERRL. EOMI. Head: Atraumatic. Nose: No congestion/rhinnorhea. Mouth/Throat: Mucous membranes are moist.  Oropharynx non-erythematous. Neck: No stridor.  No cervical spine tenderness to palpation. Hematological/Lymphatic/Immunilogical: No cervical lymphadenopathy. Cardiovascular: Normal rate, regular rhythm. Grossly normal heart sounds.  Good peripheral circulation. Respiratory: Normal respiratory effort.  No retractions. Lungs CTAB. Gastrointestinal: Soft and nontender. No distention. No abdominal bruits. No CVA tenderness. Genitourinary: Deferred Musculoskeletal: No lower extremity tenderness nor edema.  No joint effusions. Neurologic:  Normal speech and language. No gross focal  neurologic deficits are appreciated. No gait instability. Skin:  Skin is warm, dry and intact. No rash  noted. Psychiatric: Mood and affect are normal. Speech and behavior are normal.  ____________________________________________   LABS        Component Ref Range & Units 4 d ago (07/05/21) 1 yr ago (05/03/20) 2 yr ago (04/19/19) 2 yr ago (11/24/18)  Color, UA  yellow  Yellow  yellow  Yellow   Clarity, UA  clear  Clear  clear  cloudy   Glucose, UA Negative Negative  Negative  Negative  Negative   Bilirubin, UA  negative  Negative  negative  neg   Ketones, UA  negative  Negative  negative  neg   Spec Grav, UA 1.010 - 1.025 1.020  1.020  1.015  1.020   Blood, UA  negative  Positive CM  negative  neg   pH, UA 5.0 - 8.0 6.5  6.0  8.0  7.0   Protein, UA Negative Negative  Negative  Negative  Negative   Urobilinogen, UA 0.2 or 1.0 E.U./dL 0.2  0.2  0.2  0.2   Nitrite, UA  negative  Negative  negative  neg   Leukocytes, UA Negative Negative  Negative  Negative  Negative   Appearance          Odor                 Specimen Collected: 07/05/21 10:03 Last Resulted: 07/05/21 10:03      Lab Flowsheet    Order Details    View Encounter    Lab and Collection Details    Routing    Result History    View Encounter Conversation      CM=Additional comments      Result Care Coordination   Patient Communication   Add Comments   Not seen Back to Top       Other Results from 07/05/2021   Contains abnormal data CMP12+LP+TP+TSH+6AC+CBC/D/Plt Order: [id number] Status: Final result    Visible to patient: No (inaccessible in MyChart)    Next appt: 08/02/2021 at 11:30 AM in [facility] ([facility])    Dx: Encounter for physical examination re...    0 Result Notes       Component Ref Range & Units 4 d ago 1 yr ago 2 yr ago  Glucose 70 - 99 mg/dL 114 High   89 R  81 R   Uric Acid 3.8 - 8.4 mg/dL 4.6  5.1 CM  6.2 CM   Comment:            Therapeutic target for gout patients: <6.0  BUN 6 - 20 mg/dL _0 Creatinine, Ser 0.76 - 1.27 mg/dL 0.82  0.91  0.91   eGFR >59 mL/min/1.73 124   120    BUN/Creatinine Ratio 9 - _1 Sodium 134 - 144 mmol/L 140  139  139   Potassium 3.5 - 5.2 mmol/L 4.8  4.4  4.5   Chloride 96 - 106 mmol/L 99  98  100   Calcium 8.7 - 10.2 mg/dL 9.9  9.7  10.2   Phosphorus 2.8 - 4.1 mg/dL 3.9  3.1  3.3   Total Protein 6.0 - 8.5 g/dL 7.7  7.6  7.6   Albumin 4.1 - 5.2 g/dL 5.0  5.0  5.2   Globulin, Total 1.5 - 4.5 g/dL 2.7  2.6  2.4   Albumin/Globulin Ratio 1.2 -  2.2 1.9  1.9  2.2   Bilirubin Total 0.0 - 1.2 mg/dL 0.7  1.0  0.7   Alkaline Phosphatase 44 - 121 IU/L 100  102  109 R   LDH 121 - 224 IU/L 192  167  192   AST 0 - 40 IU/L _0 ALT 0 - 44 IU/L _1 GGT 0 - 65 IU/L _2 Iron 38 - 169 ug/dL 118  108  93   Cholesterol, Total 100 - 199 mg/dL 161  150  157   Triglycerides 0 - 149 mg/dL 104  69  59   HDL >39 mg/dL 59  74  75   VLDL Cholesterol Cal 5 - 40 mg/dL _3 LDL Chol Calc (NIH) 0 - 99 mg/dL 83  62  70   Chol/HDL Ratio 0.0 - 5.0 ratio 2.7  2.0 CM  2.1 CM   Comment:                                   T. Chol/HDL Ratio                                              Men  Women                                1/2 Avg.Risk  3.4    3.3                                    Avg.Risk  5.0    4.4                                 2X Avg.Risk  9.6    7.1                                 3X Avg.Risk 23.4   11.0   Estimated CHD Risk 0.0 - 1.0 times avg.  < 0.5   < 0.5 CM   < 0.5 CM   Comment: The CHD Risk is based on the T. Chol/HDL ratio. Other  factors affect CHD Risk such as hypertension, smoking,  diabetes, severe obesity, and family history of  premature CHD.   TSH 0.450 - 4.500 uIU/mL 0.742  0.852  1.180   T4, Total 4.5 - 12.0 ug/dL 8.2  8.0  6.9   T3 Uptake Ratio 24 - 39 % _4 Free Thyroxine Index 1.2 - 4.9 2.1  2.3  1.9   WBC 3.4 - 10.8 x10E3/uL 6.8  6.4  7.7   RBC 4.14 - 5.80 x10E6/uL 5.04  4.89  5.08   Hemoglobin 13.0 - 17.7 g/dL 14.4  14.1  14.9   Hematocrit 37.5 - 51.0 % 42.9  43.1  43.5    MCV 79 - 97 fL 85  88  86   MCH 26.6 - 33.0 pg 28.6  28.8  29.3   MCHC 31.5 - 35.7 g/dL 33.6  32.7  34.3   RDW 11.6 - 15.4 % 12.1  12.1  11.9   Platelets 150 - 450 x10E3/uL 420  334  392   Neutrophils Not Estab. % 65  55  56   Lymphs Not Estab. % _0 Monocytes Not Estab. % _1 Eos Not Estab. % _2 Basos Not Estab. % _3 Neutrophils Absolute 1.4 - 7.0 x10E3/uL 4.5  3.6  4.3   Lymphocytes Absolute 0.7 - 3.1 x10E3/uL 1.7  1.8  2.1   Monocytes Absolute 0.1 - 0.9 x10E3/uL 0.4  0.4  0.4   EOS (ABSOLUTE) 0.0 - 0.4 x10E3/uL 0.2  0.6 High   0.8 High    Basophils Absolute 0.0 - 0.2 x10E3/uL 0.0  0.0  0.1   Immature Granulocytes Not Estab. % 0  0  0   Immature Grans          ____________________________________________  EKG Sinus  Rhythm  WITHIN NORMAL LIMITS  ____________________________________________    ____________________________________________   INITIAL IMPRESSION / ASSESSMENT AND PLAN  As part of my medical decision making, I reviewed the following data within the electronic MEDICAL RECORD NUMBER      Discussed lab results with patient showing Elevation of glucose of 114.  No acute findings on EKG.  Patient failed today's hearing test and we will reschedule test.  We will consider consult for ENT due to high-frequency hearing loss.  Need to note this patient a candidate for hearing aids that can be used while performing his duties.        ____________________________________________   FINAL CLINICAL IMPRESSION(S) / ED DIAGNOSES  _4 @   ED Discharge Orders          Ordered    Hearing screening        Pending    Vision screening        Pending             Ordered    EKG 12-Lead  Status:  Canceled        07/05/21 1002             Note:  This document was prepared using Dragon voice recognition software and may include unintentional dictation errors.

## 2021-07-15 ENCOUNTER — Encounter: Payer: Self-pay | Admitting: Physician Assistant

## 2021-08-02 ENCOUNTER — Ambulatory Visit: Payer: Self-pay

## 2021-08-02 DIAGNOSIS — Z0110 Encounter for examination of ears and hearing without abnormal findings: Secondary | ICD-10-CM

## 2021-08-14 ENCOUNTER — Other Ambulatory Visit: Payer: Self-pay

## 2021-08-14 DIAGNOSIS — F900 Attention-deficit hyperactivity disorder, predominantly inattentive type: Secondary | ICD-10-CM

## 2021-08-14 MED ORDER — AMPHETAMINE-DEXTROAMPHET ER 15 MG PO CP24: 15.0000 mg | ORAL_CAPSULE | Freq: Every morning | ORAL | 0 refills | Status: DC

## 2021-08-14 NOTE — Telephone Encounter (Signed)
Refill granted -adderall XR  15 mg 24 Hr  #30  No refills

## 2021-08-28 ENCOUNTER — Other Ambulatory Visit: Payer: Self-pay

## 2021-08-28 NOTE — Progress Notes (Signed)
Here for random drug test and completed per protocol

## 2021-10-21 ENCOUNTER — Other Ambulatory Visit: Payer: Self-pay

## 2021-10-21 DIAGNOSIS — F900 Attention-deficit hyperactivity disorder, predominantly inattentive type: Secondary | ICD-10-CM

## 2021-10-21 MED ORDER — AMPHETAMINE-DEXTROAMPHET ER 15 MG PO CP24: 15.0000 mg | ORAL_CAPSULE | Freq: Every morning | ORAL | 0 refills | Status: DC

## 2021-11-25 ENCOUNTER — Other Ambulatory Visit: Payer: Self-pay

## 2021-11-25 DIAGNOSIS — F900 Attention-deficit hyperactivity disorder, predominantly inattentive type: Secondary | ICD-10-CM

## 2021-11-26 MED ORDER — AMPHETAMINE-DEXTROAMPHET ER 15 MG PO CP24: 15.0000 mg | ORAL_CAPSULE | Freq: Every morning | ORAL | 0 refills | Status: DC

## 2021-12-26 ENCOUNTER — Other Ambulatory Visit: Payer: Self-pay

## 2021-12-26 DIAGNOSIS — F900 Attention-deficit hyperactivity disorder, predominantly inattentive type: Secondary | ICD-10-CM

## 2021-12-26 MED ORDER — AMPHETAMINE-DEXTROAMPHET ER 15 MG PO CP24: 15.0000 mg | ORAL_CAPSULE | Freq: Every morning | ORAL | 0 refills | Status: DC

## 2022-02-11 ENCOUNTER — Other Ambulatory Visit: Payer: Self-pay

## 2022-02-11 DIAGNOSIS — F900 Attention-deficit hyperactivity disorder, predominantly inattentive type: Secondary | ICD-10-CM

## 2022-02-11 MED ORDER — AMPHETAMINE-DEXTROAMPHET ER 15 MG PO CP24: 15.0000 mg | ORAL_CAPSULE | Freq: Every morning | ORAL | 0 refills | Status: DC

## 2022-03-27 ENCOUNTER — Other Ambulatory Visit: Payer: Self-pay

## 2022-03-27 DIAGNOSIS — F900 Attention-deficit hyperactivity disorder, predominantly inattentive type: Secondary | ICD-10-CM

## 2022-03-27 MED ORDER — AMPHETAMINE-DEXTROAMPHET ER 15 MG PO CP24: 15.0000 mg | ORAL_CAPSULE | Freq: Every morning | ORAL | 0 refills | Status: DC

## 2022-04-08 ENCOUNTER — Ambulatory Visit: Payer: Self-pay

## 2022-04-08 DIAGNOSIS — Z0289 Encounter for other administrative examinations: Secondary | ICD-10-CM

## 2022-04-08 LAB — POCT URINALYSIS DIPSTICK
Bilirubin, UA: NEGATIVE
Blood, UA: NEGATIVE
Glucose, UA: NEGATIVE
Ketones, UA: NEGATIVE
Leukocytes, UA: NEGATIVE
Nitrite, UA: NEGATIVE
Protein, UA: NEGATIVE
Spec Grav, UA: 1.015 (ref 1.010–1.025)
Urobilinogen, UA: 0.2 E.U./dL
pH, UA: 8 (ref 5.0–8.0)

## 2022-04-08 NOTE — Progress Notes (Signed)
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

## 2022-04-09 LAB — CMP12+LP+TP+TSH+6AC+CBC/D/PLT
ALT: 22 IU/L (ref 0–44)
AST: 23 IU/L (ref 0–40)
Albumin/Globulin Ratio: 1.8 (ref 1.2–2.2)
Albumin: 4.6 g/dL (ref 4.3–5.2)
Alkaline Phosphatase: 94 IU/L (ref 44–121)
BUN/Creatinine Ratio: 16 (ref 9–20)
BUN: 12 mg/dL (ref 6–20)
Basophils Absolute: 0 10*3/uL (ref 0.0–0.2)
Basos: 0 %
Bilirubin Total: 1 mg/dL (ref 0.0–1.2)
Calcium: 9.4 mg/dL (ref 8.7–10.2)
Chloride: 101 mmol/L (ref 96–106)
Chol/HDL Ratio: 2.3 ratio (ref 0.0–5.0)
Cholesterol, Total: 144 mg/dL (ref 100–199)
Creatinine, Ser: 0.73 mg/dL — ABNORMAL LOW (ref 0.76–1.27)
EOS (ABSOLUTE): 0.5 10*3/uL — ABNORMAL HIGH (ref 0.0–0.4)
Eos: 9 %
Estimated CHD Risk: <0.5 times avg. (ref 0.0–1.0)
Free Thyroxine Index: 1.8 (ref 1.2–4.9)
GGT: 17 IU/L (ref 0–65)
Globulin, Total: 2.5 g/dL (ref 1.5–4.5)
Glucose: 87 mg/dL (ref 70–99)
HDL: 64 mg/dL (ref >39)
Hematocrit: 40.4 % (ref 37.5–51.0)
Hemoglobin: 13.9 g/dL (ref 13.0–17.7)
Immature Grans (Abs): 0 10*3/uL (ref 0.0–0.1)
Immature Granulocytes: 0 %
Iron: 118 ug/dL (ref 38–169)
LDH: 158 IU/L (ref 121–224)
LDL Chol Calc (NIH): 66 mg/dL (ref 0–99)
Lymphocytes Absolute: 1.5 10*3/uL (ref 0.7–3.1)
Lymphs: 30 %
MCH: 29.4 pg (ref 26.6–33.0)
MCHC: 34.4 g/dL (ref 31.5–35.7)
MCV: 86 fL (ref 79–97)
Monocytes Absolute: 0.3 10*3/uL (ref 0.1–0.9)
Monocytes: 7 %
Neutrophils Absolute: 2.7 10*3/uL (ref 1.4–7.0)
Neutrophils: 54 %
Phosphorus: 3.3 mg/dL (ref 2.8–4.1)
Platelets: 334 10*3/uL (ref 150–450)
Potassium: 4.7 mmol/L (ref 3.5–5.2)
RBC: 4.72 x10E6/uL (ref 4.14–5.80)
RDW: 11.6 % (ref 11.6–15.4)
Sodium: 142 mmol/L (ref 134–144)
T3 Uptake Ratio: 25 % (ref 24–39)
T4, Total: 7.3 ug/dL (ref 4.5–12.0)
TSH: 1.17 u[IU]/mL (ref 0.450–4.500)
Total Protein: 7.1 g/dL (ref 6.0–8.5)
Triglycerides: 72 mg/dL (ref 0–149)
Uric Acid: 5.1 mg/dL (ref 3.8–8.4)
VLDL Cholesterol Cal: 14 mg/dL (ref 5–40)
WBC: 5 10*3/uL (ref 3.4–10.8)
eGFR: 129 mL/min/{1.73_m2} (ref >59)

## 2022-04-14 ENCOUNTER — Encounter: Payer: Self-pay | Admitting: Physician Assistant

## 2022-04-14 ENCOUNTER — Ambulatory Visit: Payer: Self-pay | Admitting: Physician Assistant

## 2022-04-14 VITALS — BP 112/78 | HR 64 | Temp 97.6°F | Resp 12 | Ht 66.0 in | Wt 135.0 lb

## 2022-04-14 DIAGNOSIS — L740 Miliaria rubra: Secondary | ICD-10-CM

## 2022-04-14 DIAGNOSIS — H903 Sensorineural hearing loss, bilateral: Secondary | ICD-10-CM

## 2022-04-14 DIAGNOSIS — Z0289 Encounter for other administrative examinations: Secondary | ICD-10-CM

## 2022-04-14 NOTE — Progress Notes (Signed)
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 He describes it as extremely uncomfortable, painful, itching and burning.

## 2022-04-14 NOTE — Addendum Note (Signed)
Addended by: Aliene Altes on: 04/14/2022 10:17 AM   Modules accepted: Orders

## 2022-04-14 NOTE — Addendum Note (Signed)
Addended by: Aliene Altes on: 04/14/2022 11:14 AM   Modules accepted: Orders

## 2022-04-14 NOTE — Progress Notes (Signed)
[facility] ____________________________________________   None    (approximate)  I have reviewed the triage vital signs and the nursing notes.   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  Patient voiced concern for hearing loss.  Patient also concerned about a rash that appears to with exertion.  Patient has pictures of the rash which he shows after exercising in the gym.  Patient described this is a heat rash which is "itchy/stinging".  Rash resolves after cooling.  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  Patient was shown to have moderate hearing loss on on audiology test last year and this week.      Past Medical History:  Diagnosis Date   ADHD    COVID-19     Patient Active Problem List   Diagnosis Date Noted   Attention deficit hyperactivity disorder (ADHD), predominantly inattentive type 06/09/2018   Anxiety disorder 08/22/2016   Allergic rhinitis 05/27/2011   Concern about growth 05/27/2011   Peanut allergy 05/27/2011    Past Surgical History:  Procedure Laterality Date   PILONIDAL CYST EXCISION N/A 07/11/2020   Procedure: CYST EXCISION PILONIDAL EXTENSIVE;  Surgeon: Robert Bellow, MD;  Location: [facility] ORS;  Service: General;  Laterality: N/A;   TONSILLECTOMY     WISDOM TOOTH EXTRACTION      Prior to Admission medications   Medication Sig Start Date End Date Taking? Authorizing Provider  amphetamine-dextroamphetamine (ADDERALL XR) 15 MG 24 hr capsule Take 1 capsule by mouth every morning. 03/27/22  Yes Sable Feil, PA-C  methylphenidate 36 MG PO CR tablet Take 36 mg by mouth daily.  10/02/18  [provider]    Allergies Peanuts [peanut oil] and Shellfish allergy  Family History  Problem Relation Age of Onset   Healthy Mother    Healthy Father     Social History Social  History   Tobacco Use   Smoking status: Never   Smokeless tobacco: Current  Vaping Use   Vaping Use: Never used  Substance Use Topics   Alcohol use: Yes    Comment: social   Drug use: No    Review of Systems Constitutional: No fever/chills Eyes: No visual changes. ENT: No sore throat. Cardiovascular: Denies chest pain. Respiratory: Denies shortness of breath. Gastrointestinal: No abdominal pain.  No nausea, no vomiting.  No diarrhea.  No constipation. Genitourinary: Negative for dysuria. Musculoskeletal: Negative for back pain. Skin: Negative for rash. Neurological: Negative for headaches, focal weakness or numbness. Psychiatric: ADHD and anxiety. Allergic/Immunilogical: Peanuts  ____________________________________________   PHYSICAL EXAM:  VITAL SIGNS: BP is 112/78, pulse 64, respiration 12, temperature 97.6, and patient 1% O2 sat on room air.  Patient was on 35 pounds BMI is 21.79. Constitutional: Alert and oriented. Well appearing and in no acute distress. Eyes: Conjunctivae are normal. PERRL. EOMI. Head: Atraumatic. Nose: No congestion/rhinnorhea. EAR: Visible canal.  See audiology chart.  Patient failed whisper test. Mouth/Throat: Mucous membranes are moist.  Oropharynx non-erythematous. Neck: No stridor.  No cervical spine tenderness to palpation. Hematological/Lymphatic/Immunilogical: No cervical lymphadenopathy. Cardiovascular: Normal rate, regular rhythm. Grossly normal heart sounds.  Good peripheral circulation. Respiratory: Normal respiratory effort.  No retractions. Lungs CTAB. Gastrointestinal: Soft and nontender. No distention. No abdominal bruits. No CVA tenderness. Genitourinary: Deferred Musculoskeletal: No lower extremity tenderness nor edema.  No joint effusions. Neurologic:  Normal speech and  language. No gross focal neurologic deficits are appreciated. No gait instability. Skin:  Skin is warm, dry and intact. No rash noted. Psychiatric: Mood and  affect are normal. Speech and behavior are normal.  ____________________________________________   LABS          Component Ref Range & Units 6 d ago 9 mo ago 1 yr ago 2 yr ago 3 yr ago  Color, UA  Yellow yellow Yellow yellow Yellow  Clarity, UA  Clear clear Clear clear cloudy  Glucose, UA Negative Negative Negative Negative Negative Negative  Bilirubin, UA  Negative negative Negative negative neg  Ketones, UA  Negative negative Negative negative neg  Spec Grav, UA 1.010 - 1.025 1.015 1.020 1.020 1.015 1.020  Blood, UA  Negative negative Positive CM negative neg  pH, UA 5.0 - 8.0 8.0 6.5 6.0 8.0 7.0  Protein, UA Negative Negative Negative Negative Negative Negative  Urobilinogen, UA 0.2 or 1.0 E.U./dL 0.2 0.2 0.2 0.2 0.2  Nitrite, UA  Negative negative Negative negative neg  Leukocytes, UA Negative Negative Negative Negative Negative Negative  Appearance        Odor               Specimen Collected: 04/08/22 09:51 Last Resulted: 04/08/22 09:51      Lab Flowsheet      Order Details      View Encounter      Lab and Collection Details      Routing      Result History    View All Conversations on this Encounter      CM=Additional comments      Result Care Coordination   Patient Communication   Add Comments   Not seen Back to Top      Other Results from 04/08/2022   Contains abnormal data CMP12+LP+TP+TSH+6AC+CBC/D/Plt Order: [id number] Status: Final result      Visible to patient: No (inaccessible in MyChart)      Next appt: None      Dx: Encounter for physical examination re...    0 Result Notes           Component Ref Range & Units 6 d ago 9 mo ago 1 yr ago 2 yr ago  Glucose 70 - 99 mg/dL 87 114 High  89 R 81 R  Uric Acid 3.8 - 8.4 mg/dL 5.1 4.6 CM 5.1 CM 6.2 CM  Comment:            Therapeutic target for gout patients: <6.0  BUN 6 - 20 mg/dL '12 14 15 14  '$ Creatinine, Ser 0.76 - 1.27 mg/dL 0.73 Low  0.82 0.91 0.91  eGFR >59 mL/min/1.73 129 124 120    BUN/Creatinine Ratio 9 - '20 16 17 16 15  '$ Sodium 134 - 144 mmol/L 142 140 139 139  Potassium 3.5 - 5.2 mmol/L 4.7 4.8 4.4 4.5  Chloride 96 - 106 mmol/L 101 99 98 100  Calcium 8.7 - 10.2 mg/dL 9.4 9.9 9.7 10.2  Phosphorus 2.8 - 4.1 mg/dL 3.3 3.9 3.1 3.3  Total Protein 6.0 - 8.5 g/dL 7.1 7.7 7.6 7.6  Albumin 4.3 - 5.2 g/dL 4.6 5.0 R 5.0 R 5.2 R  Globulin, Total 1.5 - 4.5 g/dL 2.5 2.7 2.6 2.4  Albumin/Globulin Ratio 1.2 - 2.2 1.8 1.9 1.9 2.2  Bilirubin Total 0.0 - 1.2 mg/dL 1.0 0.7 1.0 0.7  Alkaline Phosphatase 44 - 121 IU/L 94 100 102 109 R  LDH 121 - 224 IU/L 158 192 167 192  AST 0 - 40 IU/L '23 27 24 23  '$ ALT 0 - 44 IU/L '22 27 13 13  '$ GGT 0 - 65 IU/L '17 21 19 16  '$ Iron 38 - 169 ug/dL 118 118 108 93  Cholesterol, Total 100 - 199 mg/dL 144 161 150 157  Triglycerides 0 - 149 mg/dL 72 104 69 59  HDL >39 mg/dL 64 59 74 75  VLDL Cholesterol Cal 5 - 40 mg/dL '14 19 14 12  '$ LDL Chol Calc (NIH) 0 - 99 mg/dL 66 83 62 70  Chol/HDL Ratio 0.0 - 5.0 ratio 2.3 2.7 CM 2.0 CM 2.1 CM  Comment:                                   T. Chol/HDL Ratio                                             Men  Women                               1/2 Avg.Risk  3.4    3.3                                   Avg.Risk  5.0    4.4                                2X Avg.Risk  9.6    7.1                                3X Avg.Risk 23.4   11.0  Estimated CHD Risk 0.0 - 1.0 times avg.  < 0.5  < 0.5 CM  < 0.5 CM  < 0.5 CM  Comment: The CHD Risk is based on the T. Chol/HDL ratio. Other factors affect CHD Risk such as hypertension, smoking, diabetes, severe obesity, and family history of premature CHD.  TSH 0.450 - 4.500 uIU/mL 1.170 0.742 0.852 1.180  T4, Total 4.5 - 12.0 ug/dL 7.3 8.2 8.0 6.9  T3 Uptake Ratio 24 - 39 % '25 25 29 27  '$ Free Thyroxine Index 1.2 - 4.9 1.8 2.1 2.3 1.9  WBC 3.4 - 10.8 x10E3/uL 5.0 6.8 6.4 7.7  RBC 4.14 - 5.80 x10E6/uL 4.72 5.04 4.89 5.08  Hemoglobin 13.0 - 17.7 g/dL 13.9 14.4 14.1 14.9  Hematocrit 37.5  - 51.0 % 40.4 42.9 43.1 43.5  MCV 79 - 97 fL 86 85 88 86  MCH 26.6 - 33.0 pg 29.4 28.6 28.8 29.3  MCHC 31.5 - 35.7 g/dL 34.4 33.6 32.7 34.3  RDW 11.6 - 15.4 % 11.6 12.1 12.1 11.9  Platelets 150 - 450 x10E3/uL 334 420 334 392  Neutrophils Not Estab. % 54 65 55 56  Lymphs Not Estab. % '30 25 28 27  '$ Monocytes Not Estab. % '7 6 7 5  '$ Eos Not Estab. % '9 3 9 11  '$ Basos Not Estab. % 0 '1 1 1  '$ Neutrophils Absolute 1.4 - 7.0 x10E3/uL 2.7 4.5 3.6 4.3  Lymphocytes Absolute 0.7 - 3.1 x10E3/uL  1.5 1.7 1.8 2.1  Monocytes Absolute 0.1 - 0.9 x10E3/uL 0.3 0.4 0.4 0.4  EOS (ABSOLUTE) 0.0 - 0.4 x10E3/uL 0.5 High  0.2 0.6 High  0.8 High   Basophils Absolute 0.0 - 0.2 x10E3/uL 0.0 0.0 0.0 0.1  Immature Granulocytes Not Estab. % 0 0 0 0             ____________________________________________  EKG Sinus rhythm at 60 bpm.  Voltage s consistent with asymptomatic LVH.  ____________________________________________    ____________________________________________   INITIAL IMPRESSION / ASSESSMENT AND PLAN  As part of my medical decision making, I reviewed the following data within the [facility]      Discussed hearing loss and rationale for consult to audiology.  Patient also will be consulted out to dermatology secondary to exertional rash.        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  ED Discharge Orders     None        Note:  This document was prepared using Dragon voice recognition software and may include unintentional dictation errors.

## 2022-04-30 ENCOUNTER — Other Ambulatory Visit: Payer: Self-pay

## 2022-04-30 DIAGNOSIS — F900 Attention-deficit hyperactivity disorder, predominantly inattentive type: Secondary | ICD-10-CM

## 2022-04-30 MED ORDER — AMPHETAMINE-DEXTROAMPHET ER 15 MG PO CP24: 15.0000 mg | ORAL_CAPSULE | Freq: Every morning | ORAL | 0 refills | Status: DC

## 2022-06-11 ENCOUNTER — Other Ambulatory Visit: Payer: Self-pay

## 2022-06-11 DIAGNOSIS — F900 Attention-deficit hyperactivity disorder, predominantly inattentive type: Secondary | ICD-10-CM

## 2022-06-11 MED ORDER — AMPHETAMINE-DEXTROAMPHET ER 15 MG PO CP24: 15.0000 mg | ORAL_CAPSULE | Freq: Every morning | ORAL | 0 refills | Status: DC

## 2022-06-24 ENCOUNTER — Other Ambulatory Visit: Payer: Self-pay

## 2022-06-24 DIAGNOSIS — Z0283 Encounter for blood-alcohol and blood-drug test: Secondary | ICD-10-CM

## 2022-06-24 NOTE — Progress Notes (Signed)
Presents to [facility] for on-site random drug screen & breath alcohol screen.  [facility] Acct #:  [id number] [facility] Specimen #:  [id number]  Breath Alcohol Results = 0.000  AMD

## 2022-07-11 ENCOUNTER — Other Ambulatory Visit: Payer: Self-pay

## 2022-07-11 DIAGNOSIS — F900 Attention-deficit hyperactivity disorder, predominantly inattentive type: Secondary | ICD-10-CM

## 2022-07-11 MED ORDER — AMPHETAMINE-DEXTROAMPHET ER 15 MG PO CP24: 15.0000 mg | ORAL_CAPSULE | Freq: Every morning | ORAL | 0 refills | Status: DC

## 2022-07-21 ENCOUNTER — Ambulatory Visit: Payer: Self-pay | Admitting: Physician Assistant

## 2022-07-21 DIAGNOSIS — H6122 Impacted cerumen, left ear: Secondary | ICD-10-CM

## 2022-07-21 NOTE — Progress Notes (Signed)
Request to see provider as stated cannot hear anything from left ear when he cleaned it this morning.  Stated hx poor hearing and tests on file.

## 2022-07-21 NOTE — Progress Notes (Signed)
[facility] [address] [place], [place] [address]   Office Visit Note  Patient Name: Garrett Hamilton Date of Birth [birth year]  Medical Record number [id number]  Date of Service: 07/21/2022  Chief Complaint  Patient presents with   Ear Fullness     27 y/o M presents to the clinic for ear fullness/unable to hear out of left ear at all since earlier today. +h/o tinnitus and knows he is in need of hearing aid in R ear. Currently unable to hear out of L ear. No recent air travel. No n/v/dizziness. No ear drainage.   Ear Fullness  Pertinent negatives include no ear discharge.      Current Medication:  Outpatient Encounter Medications as of 07/21/2022  Medication Sig   amphetamine-dextroamphetamine (ADDERALL XR) 15 MG 24 hr capsule Take 1 capsule by mouth every morning.   [DISCONTINUED] methylphenidate 36 MG PO CR tablet Take 36 mg by mouth daily.   No facility-administered encounter medications on file as of 07/21/2022.      Medical History: Past Medical History:  Diagnosis Date   ADHD    COVID-19      Vital Signs: There were no vitals taken for this visit.   Review of Systems  HENT:  Negative for congestion, ear discharge and ear pain.     Physical Exam Constitutional:      Appearance: Normal appearance. He is normal weight.  HENT:     Head: Normocephalic and atraumatic.     Right Ear: Tympanic membrane normal.     Left Ear: There is impacted cerumen.  Musculoskeletal:     Cervical back: Normal range of motion.  Neurological:     General: No focal deficit present.     Mental Status: He is alert and oriented to person, place, and time.  Psychiatric:        Mood and Affect: Mood normal.        Behavior: Behavior normal.       Assessment/Plan:  1. Impacted cerumen of left ear  Unsuccessful attempt to remove cerumen with ear lavage.  Will add hydrogen peroxide drops daily x 2 days and return to clinic on Wednesday to try to attempt to remove  cerumen. Pt states he will attempt to see ENT to have cerumen removed either today or tomorrow if appointment available.  Pt verbalized understanding and in agreement.    General Counseling: lynton crescenzo understanding of the findings of todays visit and agrees with plan of treatment. I have discussed any further diagnostic evaluation that may be needed or ordered today. We also reviewed his medications today. he has been encouraged to call the office with any questions or concerns that should arise related to todays visit.    Time spent:20 Minutes    Gilberto Better, [place] Physician Assistant

## 2022-08-21 ENCOUNTER — Other Ambulatory Visit: Payer: Self-pay

## 2022-08-21 DIAGNOSIS — F900 Attention-deficit hyperactivity disorder, predominantly inattentive type: Secondary | ICD-10-CM

## 2022-08-21 MED ORDER — AMPHETAMINE-DEXTROAMPHET ER 15 MG PO CP24: 15.0000 mg | ORAL_CAPSULE | Freq: Every morning | ORAL | 0 refills | Status: DC

## 2022-09-29 ENCOUNTER — Other Ambulatory Visit: Payer: Self-pay

## 2022-09-29 DIAGNOSIS — F900 Attention-deficit hyperactivity disorder, predominantly inattentive type: Secondary | ICD-10-CM

## 2022-09-29 MED ORDER — AMPHETAMINE-DEXTROAMPHET ER 15 MG PO CP24: 15.0000 mg | ORAL_CAPSULE | Freq: Every morning | ORAL | 0 refills | Status: DC

## 2022-11-10 ENCOUNTER — Other Ambulatory Visit: Payer: Self-pay

## 2022-11-10 DIAGNOSIS — F900 Attention-deficit hyperactivity disorder, predominantly inattentive type: Secondary | ICD-10-CM

## 2022-11-10 MED ORDER — AMPHETAMINE-DEXTROAMPHET ER 15 MG PO CP24: 15.0000 mg | ORAL_CAPSULE | Freq: Every morning | ORAL | 0 refills | Status: DC

## 2022-11-11 ENCOUNTER — Other Ambulatory Visit: Payer: Self-pay

## 2022-11-11 DIAGNOSIS — F900 Attention-deficit hyperactivity disorder, predominantly inattentive type: Secondary | ICD-10-CM

## 2022-11-11 MED ORDER — AMPHETAMINE-DEXTROAMPHET ER 15 MG PO CP24: 15.0000 mg | ORAL_CAPSULE | Freq: Every morning | ORAL | 0 refills | Status: DC

## 2022-12-16 ENCOUNTER — Other Ambulatory Visit: Payer: Self-pay

## 2022-12-16 DIAGNOSIS — F900 Attention-deficit hyperactivity disorder, predominantly inattentive type: Secondary | ICD-10-CM

## 2022-12-16 MED ORDER — AMPHETAMINE-DEXTROAMPHET ER 15 MG PO CP24
15.0000 mg | ORAL_CAPSULE | Freq: Every morning | ORAL | 0 refills | Status: DC
Start: 2022-12-16 — End: 2023-03-17

## 2023-03-17 ENCOUNTER — Other Ambulatory Visit: Payer: Self-pay

## 2023-03-17 DIAGNOSIS — F900 Attention-deficit hyperactivity disorder, predominantly inattentive type: Secondary | ICD-10-CM

## 2023-03-17 MED ORDER — AMPHETAMINE-DEXTROAMPHET ER 15 MG PO CP24
15.0000 mg | ORAL_CAPSULE | Freq: Every morning | ORAL | 0 refills | Status: DC
Start: 2023-03-17 — End: 2023-04-27

## 2023-03-31 ENCOUNTER — Ambulatory Visit: Payer: Self-pay

## 2023-03-31 DIAGNOSIS — Z0289 Encounter for other administrative examinations: Secondary | ICD-10-CM

## 2023-03-31 LAB — POCT URINALYSIS DIPSTICK
Bilirubin, UA: NEGATIVE
Blood, UA: NEGATIVE
Glucose, UA: NEGATIVE
Ketones, UA: NEGATIVE
Leukocytes, UA: NEGATIVE
Nitrite, UA: NEGATIVE
Protein, UA: NEGATIVE
Spec Grav, UA: 1.025 (ref 1.010–1.025)
Urobilinogen, UA: 0.2 U/dL
pH, UA: 6 (ref 5.0–8.0)

## 2023-03-31 NOTE — Progress Notes (Signed)
 Pt completed labs for FF physical. Gretel Acre

## 2023-04-01 LAB — CMP12+LP+TP+TSH+6AC+CBC/D/PLT
ALT: 15 [IU]/L (ref 0–44)
AST: 19 [IU]/L (ref 0–40)
Albumin: 4.6 g/dL (ref 4.3–5.2)
Alkaline Phosphatase: 96 [IU]/L (ref 44–121)
BUN/Creatinine Ratio: 17 (ref 9–20)
BUN: 13 mg/dL (ref 6–20)
Basophils Absolute: 0 10*3/uL (ref 0.0–0.2)
Basos: 0 %
Bilirubin Total: 1.1 mg/dL (ref 0.0–1.2)
Calcium: 9.4 mg/dL (ref 8.7–10.2)
Chloride: 101 mmol/L (ref 96–106)
Chol/HDL Ratio: 2.2 {ratio} (ref 0.0–5.0)
Cholesterol, Total: 143 mg/dL (ref 100–199)
Creatinine, Ser: 0.75 mg/dL — ABNORMAL LOW (ref 0.76–1.27)
EOS (ABSOLUTE): 0.3 10*3/uL (ref 0.0–0.4)
Eos: 6 %
Estimated CHD Risk: <0.5 times avg. (ref 0.0–1.0)
Free Thyroxine Index: 2.6 (ref 1.2–4.9)
GGT: 13 [IU]/L (ref 0–65)
Globulin, Total: 2.6 g/dL (ref 1.5–4.5)
Glucose: 90 mg/dL (ref 70–99)
HDL: 65 mg/dL (ref >39)
Hematocrit: 40.9 % (ref 37.5–51.0)
Hemoglobin: 13.8 g/dL (ref 13.0–17.7)
Immature Grans (Abs): 0 10*3/uL (ref 0.0–0.1)
Immature Granulocytes: 0 %
Iron: 93 ug/dL (ref 38–169)
LDH: 149 [IU]/L (ref 121–224)
LDL Chol Calc (NIH): 67 mg/dL (ref 0–99)
Lymphocytes Absolute: 1.2 10*3/uL (ref 0.7–3.1)
Lymphs: 28 %
MCH: 29.2 pg (ref 26.6–33.0)
MCHC: 33.7 g/dL (ref 31.5–35.7)
MCV: 87 fL (ref 79–97)
Monocytes Absolute: 0.4 10*3/uL (ref 0.1–0.9)
Monocytes: 8 %
Neutrophils Absolute: 2.6 10*3/uL (ref 1.4–7.0)
Neutrophils: 58 %
Phosphorus: 3 mg/dL (ref 2.8–4.1)
Platelets: 327 10*3/uL (ref 150–450)
Potassium: 4.8 mmol/L (ref 3.5–5.2)
RBC: 4.73 x10E6/uL (ref 4.14–5.80)
RDW: 12.3 % (ref 11.6–15.4)
Sodium: 139 mmol/L (ref 134–144)
T3 Uptake Ratio: 30 % (ref 24–39)
T4, Total: 8.5 ug/dL (ref 4.5–12.0)
TSH: 1.08 u[IU]/mL (ref 0.450–4.500)
Total Protein: 7.2 g/dL (ref 6.0–8.5)
Triglycerides: 51 mg/dL (ref 0–149)
Uric Acid: 5.2 mg/dL (ref 3.8–8.4)
VLDL Cholesterol Cal: 11 mg/dL (ref 5–40)
WBC: 4.5 10*3/uL (ref 3.4–10.8)
eGFR: 127 mL/min/{1.73_m2} (ref >59)

## 2023-04-09 ENCOUNTER — Ambulatory Visit: Payer: Self-pay | Admitting: Physician Assistant

## 2023-04-09 ENCOUNTER — Encounter: Payer: Self-pay | Admitting: Physician Assistant

## 2023-04-09 VITALS — BP 111/71 | HR 73 | Temp 97.6°F | Resp 14 | Ht 66.0 in | Wt 138.0 lb

## 2023-04-09 DIAGNOSIS — Z0000 Encounter for general adult medical examination without abnormal findings: Secondary | ICD-10-CM

## 2023-04-09 NOTE — Progress Notes (Signed)
 Pt presents today to complete FF physical. Pt denies any issues at this time. Garrett Hamilton

## 2023-04-09 NOTE — Progress Notes (Signed)
 [facility] ____________________________________________   None    (approximate)  I have reviewed the triage vital signs and the nursing notes.   HISTORY  Chief Complaint No chief complaint on file.   HPI Garrett Hamilton is a 28 y.o. male patient presents for annual physical exam.  Patient voices no concerns or complaints.         Past Medical History:  Diagnosis Date   ADHD    COVID-19     Patient Active Problem List   Diagnosis Date Noted   Attention deficit hyperactivity disorder (ADHD), predominantly inattentive type 06/09/2018   Anxiety disorder 08/22/2016   Allergic rhinitis 05/27/2011   Concern about growth 05/27/2011   Peanut allergy 05/27/2011    Past Surgical History:  Procedure Laterality Date   PILONIDAL CYST EXCISION N/A 07/11/2020   Procedure: CYST EXCISION PILONIDAL EXTENSIVE;  Surgeon: Earline Mayotte, MD;  Location: [facility] OR[facility];  Service: General;  Laterality: N/A;   TONSILLECTOMY     WISDOM TOOTH EXTRACTION      Prior to Admission medications   Medication Sig Start Date End Date Taking? Authorizing Provider  amphetamine-dextroamphetamine (ADDERALL XR) 15 MG 24 hr capsule Take 1 capsule by mouth every morning. 03/17/23   Joni Reining, PA-C  methylphenidate 36 MG PO CR tablet Take 36 mg by mouth daily.  10/02/18  [provider]    Allergies Peanuts [peanut oil] and Shellfish allergy  Family History  Problem Relation Age of Onset   Healthy Mother    Healthy Father     Social History Social History   Tobacco Use   Smoking status: Never   Smokeless tobacco: Current  Vaping Use   Vaping status: Never Used  Substance Use Topics   Alcohol use: Yes    Comment: social   Drug use: No    Review of Systems Constitutional: No fever/chills Eyes: No visual changes. ENT: No sore throat. Cardiovascular: Denies chest pain. Respiratory: Denies shortness of breath. Gastrointestinal: No  abdominal pain.  No nausea, no vomiting.  No diarrhea.  No constipation. Genitourinary: Negative for dysuria. Musculoskeletal: Negative for back pain. Skin: Negative for rash. Neurological: Negative for headaches, focal weakness or numbness. Psychiatric: ADHD and anxiety ____________________________________________   PHYSICAL EXAM:  VITAL SIGNS: BP 111/71  Cuff Size Normal  Pulse Rate 73  Temp 97.6 F (36.4 C)  Temp Source Temporal  Weight 138 lb (62.6 kg)  Height 5\' 6"  (1.676 m)  Resp 14  SpO2 98 %   Other Vitals   BMI: 22.27 kg/m2  BSA: 1.71 m2   Constitutional: Alert and oriented. Well appearing and in no acute distress. Eyes: Conjunctivae are normal. PERRL. EOMI. Head: Atraumatic. Nose: No congestion/rhinnorhea. Mouth/Throat: Mucous membranes are moist.  Oropharynx non-erythematous. Neck: No stridor.  No cervical spine tenderness to palpation. Hematological/Lymphatic/Immunilogical: No cervical lymphadenopathy. Cardiovascular: Normal rate, regular rhythm. Grossly normal heart sounds.  Good peripheral circulation. Respiratory: Normal respiratory effort.  No retractions. Lungs CTAB. Gastrointestinal: Soft and nontender. No distention. No abdominal bruits. No CVA tenderness. Genitourinary: Deferred Musculoskeletal: No lower extremity tenderness nor edema.  No joint effusions. Neurologic:  Normal speech and language. No gross focal neurologic deficits are appreciated. No gait instability. Skin:  Skin is warm, dry and intact. No rash noted. Psychiatric: Mood and affect are normal. Speech and behavior are normal.  ____________________________________________   LABS          Component Ref Range & Units (hover) 9 d ago (03/31/23)  1 yr ago (04/08/22) 1 yr ago (07/05/21) 2 yr ago (05/03/20) 3 yr ago (04/19/19) 4 yr ago (11/24/18)  Color, UA DARK YELLOW Yellow yellow Yellow yellow Yellow  Clarity, UA CLEA Clear clear Clear clear cloudy  Glucose, UA Negative Negative  Negative Negative Negative Negative  Bilirubin, UA NEG Negative negative Negative negative neg  Ketones, UA NEG Negative negative Negative negative neg  Spec Grav, UA 1.025 1.015 1.020 1.020 1.015 1.020  Blood, UA NEG Negative negative Positive CM negative neg  pH, UA 6.0 8.0 6.5 6.0 8.0 7.0  Protein, UA Negative Negative Negative Negative Negative Negative  Urobilinogen, UA 0.2 0.2 0.2 0.2 0.2 0.2  Nitrite, UA NEG Negative negative Negative negative neg  Leukocytes, UA Negative Negative Negative Negative Negative Negative  Appearance        Odor                   View All Conversations on this Encounter             Component Ref Range & Units (hover) 9 d ago (03/31/23) 1 yr ago (04/08/22) 1 yr ago (07/05/21) 2 yr ago (05/03/20) 3 yr ago (04/19/19)  Glucose 90 87 114 High  89 R 81 R  Uric Acid 5.2 5.1 CM 4.6 CM 5.1 CM 6.2 CM  Comment:            Therapeutic target for gout patients: <6.0  BUN 13 12 14 15 14   Creatinine, Ser 0.75 Low  0.73 Low  0.82 0.91 0.91  eGFR 127 129 124 120   BUN/Creatinine Ratio 17 16 17 16 15   Sodium 139 142 140 139 139  Potassium 4.8 4.7 4.8 4.4 4.5  Chloride 101 101 99 98 100  Calcium 9.4 9.4 9.9 9.7 10.2  Phosphorus 3.0 3.3 3.9 3.1 3.3  Total Protein 7.2 7.1 7.7 7.6 7.6  Albumin 4.6 4.6 5.0 R 5.0 R 5.2 R  Globulin, Total 2.6 2.5 2.7 2.6 2.4  Bilirubin Total 1.1 1.0 0.7 1.0 0.7  Alkaline Phosphatase 96 94 100 102 109 R  LDH 149 158 192 167 192  AST 19 23 27 24 23   ALT 15 22 27 13 13   GGT 13 17 21 19 16   Iron 93 118 118 108 93  Cholesterol, Total 143 144 161 150 157  Triglycerides 51 72 104 69 59  HDL 65 64 59 74 75  VLDL Cholesterol Cal 11 14 19 14 12   LDL Chol Calc (NIH) 67 66 83 62 70  Chol/HDL Ratio 2.2 2.3 CM 2.7 CM 2.0 CM 2.1 CM  Comment:                                   T. Chol/HDL Ratio                                             Men  Women                               1/2 Avg.Risk  3.4    3.3  Avg.Risk  5.0    4.4                                2X Avg.Risk  9.6    7.1                                3X Avg.Risk 23.4   11.0  Estimated CHD Risk  < 0.5  < 0.5 CM  < 0.5 CM  < 0.5 CM  < 0.5 CM  Comment: The CHD Risk is based on the T. Chol/HDL ratio. Other factors affect CHD Risk such as hypertension, smoking, diabetes, severe obesity, and family history of premature CHD.  TSH 1.080 1.170 0.742 0.852 1.180  T4, Total 8.5 7.3 8.2 8.0 6.9  T3 Uptake Ratio 30 25 25 29 27   Free Thyroxine Index 2.6 1.8 2.1 2.3 1.9  WBC 4.5 5.0 6.8 6.4 7.7  RBC 4.73 4.72 5.04 4.89 5.08  Hemoglobin 13.8 13.9 14.4 14.1 14.9  Hematocrit 40.9 40.4 42.9 43.1 43.5  MCV 87 86 85 88 86  MCH 29.2 29.4 28.6 28.8 29.3  MCHC 33.7 34.4 33.6 32.7 34.3  RDW 12.3 11.6 12.1 12.1 11.9  Platelets 327 334 420 334 392  Neutrophils 58 54 65 55 56  Lymphs 28 30 25 28 27   Monocytes 8 7 6 7 5   Eos 6 9 3 9 11   Basos 0 0 1 1 1   Neutrophils Absolute 2.6 2.7 4.5 3.6 4.3  Lymphocytes Absolute 1.2 1.5 1.7 1.8 2.1  Monocytes Absolute 0.4 0.3 0.4 0.4 0.4  EOS (ABSOLUTE) 0.3 0.5 High  0.2 0.6 High  0.8 High   Basophils Absolute 0.0 0.0 0.0 0.0 0.1  Immature Granulocytes 0 0 0 0 0  Immature Grans (Abs) 0.0 0.0 0.0 0.0 0.0           ____________________________________________  EKG  Sinus rhythm at 60 bpm ____________________________________________    ____________________________________________   INITIAL IMPRESSION / ASSESSMENT AND PLAN  As part of my medical decision making, I reviewed the following data within the electronic MEDICAL RECORD NUMBER      No acute findings on physical exam, EKG, and labs.        ____________________________________________   FINAL CLINICAL IMPRESSION Well exam   ED Discharge Orders     None        Note:  This document was prepared using Dragon voice recognition software and may include unintentional dictation errors.

## 2023-04-27 ENCOUNTER — Other Ambulatory Visit: Payer: Self-pay

## 2023-04-27 DIAGNOSIS — F900 Attention-deficit hyperactivity disorder, predominantly inattentive type: Secondary | ICD-10-CM

## 2023-04-27 MED ORDER — AMPHETAMINE-DEXTROAMPHET ER 15 MG PO CP24: 15.0000 mg | ORAL_CAPSULE | Freq: Every morning | ORAL | 0 refills | Status: DC

## 2023-06-03 ENCOUNTER — Other Ambulatory Visit: Payer: Self-pay

## 2023-06-03 DIAGNOSIS — F900 Attention-deficit hyperactivity disorder, predominantly inattentive type: Secondary | ICD-10-CM

## 2023-06-04 MED ORDER — AMPHETAMINE-DEXTROAMPHET ER 15 MG PO CP24: 15.0000 mg | ORAL_CAPSULE | Freq: Every morning | ORAL | 0 refills | Status: DC

## 2023-07-29 ENCOUNTER — Other Ambulatory Visit: Payer: Self-pay

## 2023-07-29 DIAGNOSIS — F900 Attention-deficit hyperactivity disorder, predominantly inattentive type: Secondary | ICD-10-CM

## 2023-07-29 MED ORDER — AMPHETAMINE-DEXTROAMPHET ER 15 MG PO CP24: 15.0000 mg | ORAL_CAPSULE | Freq: Every morning | ORAL | 0 refills | Status: DC

## 2023-09-16 ENCOUNTER — Other Ambulatory Visit: Payer: Self-pay

## 2023-09-16 DIAGNOSIS — F900 Attention-deficit hyperactivity disorder, predominantly inattentive type: Secondary | ICD-10-CM

## 2023-09-16 MED ORDER — AMPHETAMINE-DEXTROAMPHET ER 15 MG PO CP24: 15.0000 mg | ORAL_CAPSULE | Freq: Every morning | ORAL | 0 refills | Status: DC

## 2023-10-28 ENCOUNTER — Other Ambulatory Visit: Payer: Self-pay

## 2023-10-28 DIAGNOSIS — F900 Attention-deficit hyperactivity disorder, predominantly inattentive type: Secondary | ICD-10-CM

## 2023-10-28 MED ORDER — AMPHETAMINE-DEXTROAMPHET ER 15 MG PO CP24: 15.0000 mg | ORAL_CAPSULE | Freq: Every morning | ORAL | 0 refills | Status: DC

## 2023-12-08 ENCOUNTER — Other Ambulatory Visit: Payer: Self-pay

## 2023-12-08 DIAGNOSIS — F900 Attention-deficit hyperactivity disorder, predominantly inattentive type: Secondary | ICD-10-CM

## 2023-12-08 MED ORDER — AMPHETAMINE-DEXTROAMPHET ER 15 MG PO CP24: 15.0000 mg | ORAL_CAPSULE | Freq: Every morning | ORAL | 0 refills | Status: DC

## 2024-01-12 ENCOUNTER — Other Ambulatory Visit: Payer: Self-pay

## 2024-01-12 DIAGNOSIS — F900 Attention-deficit hyperactivity disorder, predominantly inattentive type: Secondary | ICD-10-CM

## 2024-01-12 MED ORDER — AMPHETAMINE-DEXTROAMPHET ER 15 MG PO CP24: 15.0000 mg | ORAL_CAPSULE | Freq: Every morning | ORAL | 0 refills | Status: DC

## 2024-02-18 ENCOUNTER — Other Ambulatory Visit: Payer: Self-pay

## 2024-02-18 DIAGNOSIS — F900 Attention-deficit hyperactivity disorder, predominantly inattentive type: Secondary | ICD-10-CM

## 2024-02-18 MED ORDER — AMPHETAMINE-DEXTROAMPHET ER 15 MG PO CP24: 15.0000 mg | ORAL_CAPSULE | Freq: Every morning | ORAL | 0 refills | Status: AC
